# Patient Record
Sex: Male | Born: 1935 | Race: White | Hispanic: No | Marital: Married | Smoking: Former smoker
Health system: Southern US, Community
[De-identification: ages and names within clinical notes are randomized; demographics above are authoritative.]

## PROBLEM LIST (undated history)

## (undated) DIAGNOSIS — M1990 Unspecified osteoarthritis, unspecified site: Secondary | ICD-10-CM

## (undated) DIAGNOSIS — G709 Myoneural disorder, unspecified: Secondary | ICD-10-CM

## (undated) DIAGNOSIS — E119 Type 2 diabetes mellitus without complications: Secondary | ICD-10-CM

## (undated) DIAGNOSIS — I4891 Unspecified atrial fibrillation: Secondary | ICD-10-CM

## (undated) DIAGNOSIS — I2510 Atherosclerotic heart disease of native coronary artery without angina pectoris: Secondary | ICD-10-CM

## (undated) DIAGNOSIS — I340 Nonrheumatic mitral (valve) insufficiency: Secondary | ICD-10-CM

## (undated) DIAGNOSIS — G459 Transient cerebral ischemic attack, unspecified: Secondary | ICD-10-CM

## (undated) DIAGNOSIS — I219 Acute myocardial infarction, unspecified: Secondary | ICD-10-CM

## (undated) DIAGNOSIS — I509 Heart failure, unspecified: Secondary | ICD-10-CM

## (undated) DIAGNOSIS — I440 Atrioventricular block, first degree: Secondary | ICD-10-CM

## (undated) DIAGNOSIS — I10 Essential (primary) hypertension: Secondary | ICD-10-CM

## (undated) DIAGNOSIS — Z89511 Acquired absence of right leg below knee: Secondary | ICD-10-CM

## (undated) DIAGNOSIS — H9190 Unspecified hearing loss, unspecified ear: Secondary | ICD-10-CM

## (undated) DIAGNOSIS — Z89512 Acquired absence of left leg below knee: Secondary | ICD-10-CM

## (undated) DIAGNOSIS — I442 Atrioventricular block, complete: Secondary | ICD-10-CM

## (undated) DIAGNOSIS — E785 Hyperlipidemia, unspecified: Secondary | ICD-10-CM

## (undated) DIAGNOSIS — I739 Peripheral vascular disease, unspecified: Secondary | ICD-10-CM

## (undated) DIAGNOSIS — D649 Anemia, unspecified: Secondary | ICD-10-CM

## (undated) DIAGNOSIS — K922 Gastrointestinal hemorrhage, unspecified: Secondary | ICD-10-CM

## (undated) HISTORY — DX: Peripheral vascular disease, unspecified: I73.9

## (undated) HISTORY — DX: Hyperlipidemia, unspecified: E78.5

## (undated) HISTORY — DX: Anemia, unspecified: D64.9

## (undated) HISTORY — DX: Gastrointestinal hemorrhage, unspecified: K92.2

## (undated) HISTORY — PX: BELOW KNEE LEG AMPUTATION: SUR23

## (undated) HISTORY — DX: Transient cerebral ischemic attack, unspecified: G45.9

## (undated) HISTORY — DX: Essential (primary) hypertension: I10

## (undated) HISTORY — DX: Type 2 diabetes mellitus without complications: E11.9

## (undated) HISTORY — PX: CATARACT EXTRACTION: SUR2

## (undated) HISTORY — DX: Atherosclerotic heart disease of native coronary artery without angina pectoris: I25.10

## (undated) HISTORY — DX: Unspecified atrial fibrillation: I48.91

## (undated) HISTORY — PX: TRANSURETHRAL RESECTION OF PROSTATE: SHX73

---

## 1992-07-22 HISTORY — PX: CORONARY ARTERY BYPASS GRAFT: SHX141

## 2006-11-20 ENCOUNTER — Encounter: Payer: Self-pay | Admitting: Cardiology

## 2006-11-28 ENCOUNTER — Ambulatory Visit: Payer: Self-pay | Admitting: Internal Medicine

## 2007-01-09 ENCOUNTER — Ambulatory Visit: Payer: Self-pay | Admitting: Internal Medicine

## 2007-02-20 ENCOUNTER — Ambulatory Visit: Payer: Self-pay | Admitting: Internal Medicine

## 2007-08-06 DIAGNOSIS — E785 Hyperlipidemia, unspecified: Secondary | ICD-10-CM

## 2007-08-06 DIAGNOSIS — E119 Type 2 diabetes mellitus without complications: Secondary | ICD-10-CM

## 2007-08-06 DIAGNOSIS — I6350 Cerebral infarction due to unspecified occlusion or stenosis of unspecified cerebral artery: Secondary | ICD-10-CM | POA: Insufficient documentation

## 2007-08-07 ENCOUNTER — Ambulatory Visit: Payer: Self-pay | Admitting: Internal Medicine

## 2010-03-22 ENCOUNTER — Encounter: Payer: Self-pay | Admitting: Cardiology

## 2010-03-27 ENCOUNTER — Ambulatory Visit: Payer: Self-pay | Admitting: Cardiology

## 2010-03-28 ENCOUNTER — Encounter: Payer: Self-pay | Admitting: Cardiology

## 2010-07-30 ENCOUNTER — Ambulatory Visit
Admission: RE | Admit: 2010-07-30 | Discharge: 2010-07-30 | Payer: Self-pay | Source: Home / Self Care | Attending: Cardiology | Admitting: Cardiology

## 2010-07-30 ENCOUNTER — Encounter: Payer: Self-pay | Admitting: Cardiology

## 2010-07-30 DIAGNOSIS — I2510 Atherosclerotic heart disease of native coronary artery without angina pectoris: Secondary | ICD-10-CM | POA: Insufficient documentation

## 2010-07-30 DIAGNOSIS — I459 Conduction disorder, unspecified: Secondary | ICD-10-CM | POA: Insufficient documentation

## 2010-07-30 DIAGNOSIS — I4891 Unspecified atrial fibrillation: Secondary | ICD-10-CM | POA: Insufficient documentation

## 2010-08-23 NOTE — Consult Note (Signed)
Summary: [facility] CARDI[facility] CONSULT  [facility]   Imported By: Zachary George 07/30/2010 12:53:41  _____________________________________________________________________  External Attachment:    Type:   Image     Comment:   External Document

## 2010-08-23 NOTE — Letter (Signed)
Summary: [facility] D/C DR.JAMES PARSONS  [facility] D/C DR.JAMES PARSONS   Imported By: Zachary George 07/30/2010 13:08:23  _____________________________________________________________________  External Attachment:    Type:   Image     Comment:   External Document

## 2010-08-23 NOTE — Assessment & Plan Note (Addendum)
Summary: eph-post  mm[facility] per wife Jahan Friedlander   Visit Type:  Follow-up Primary Provider:  Dr. [name]   History of Present Illness: 77 year old male presents for followup. He was seen as an inpatient consult at [facility] back in September 2011. Cardiology followup had been through [facility], Dr. Roxan Hockey. We were consulted at that time to assist with Coumadin therapy. The patient had presented with evidence of anemia and a presumed gastrointestinal hemorrhage. He was seen by Dr. Karilyn Cota, underwent endoscopy and colonoscopy, and had no definite bleeding source noted. There was plan for a followup small bowel capsule study. 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  He is here with his wife today. They have plans to establish with our practice. He is very hard of hearing. History is somewhat limited. He denies any active chest pain or palpitations, no dizziness or syncope. Reports no obvious bleeding problems since he has been back on Coumadin. This is followed by Dr. Loney Hering. He denies any falls.   Preventive Screening-Counseling & Management  Alcohol-Tobacco     Smoking Status: quit     Year Quit: 1942  Current Medications (verified): 1)  Bayer Low Strength 81 Mg  Tbec (Aspirin) .... Take 1 Tablet By Mouth Once A Day 2)  Imdur 60 Mg  Tb24 (Isosorbide Mononitrate) .... Take 1 Tablet By Mouth Once A Day 3)  Vitamin B-12 1000 Mcg  Tabs (Cyanocobalamin) .... Take 1 Tablet By Mouth Once A Day 4)  Lantus 100 Unit/ml  Soln (Insulin Glargine) .... Use As Directed 5)  Humulin 70/30 70-30 %  Susp (Insulin Isophane & Regular) .... Use As Directed 6)  Sotalol Hcl 80 Mg  Tabs (Sotalol Hcl) .... Take 1/4 Tab By Mouth Two Times A Day 7)  Warfarin Sodium 5 Mg Tabs (Warfarin Sodium) .... Use As Directed Per [facility] 8)  Vitamin C 500 Mg  Tabs (Ascorbic Acid) .... Take 1 Tablet By Mouth  Once A Day 9)  Zocor 40 Mg  Tabs (Simvastatin) .... Take 1 Tablet  By Mouth Once A Day 10)  Enalapril Maleate 20 Mg  Tabs (Enalapril Maleate) .... Take 1 Tablet By Mouth Once A Day 11)  Furosemide 20 Mg  Tabs (Furosemide) .... Take 1 Tablet By Mouth Once A Day 12)  Zyrtec Allergy 10 Mg Caps (Cetirizine Hcl) .... Take 1 Tablet By Mouth Once A Day 13)  Tizanidine Hcl 4 Mg Tabs (Tizanidine Hcl) .... Take 1/2 Tablet By Mouth Two Times A Day 14)  Nitroglycerin .... Use As Needed 15)  Norco 10-325 Mg Tabs (Hydrocodone-Acetaminophen) .... Take 1 Tablet By Mouth Four Times A Day As Needed Severe Pain 16)  Gabapentin 300 Mg Caps (Gabapentin) .... Take 1 Tablet By Mouth Three Times A Day 17)  Colace 100 Mg Caps (Docusate Sodium) .... Take 1 Tablet By Mouth Once A Day  Allergies (verified): 1)  ! Cipro 2)  ! Keflex  Comments:  Nurse/Medical Assistant: The patient's medication bottles and allergies were reviewed with the patient and were updated in the Medication and Allergy Lists.  Past History:  Family History: Last updated: 07/30/2010 No premature cardiovascular disease  Social History: Last updated: 07/30/2010 Married  Tobacco Use - Former.  Alcohol Use - no Retired   Past Medical History: Atrial Fibrillation - sotalol and Coumadin CAD - multivessel TIA Anemia - GI bleed Diabetes Type 2 Hyperlipidemia Peripheral arterial disease  Past Surgical History: CABG 1994 TURP Bilateral below-the-knee  amputations  Family History: No premature cardiovascular disease  Social History: Married  Tobacco Use - Former.  Alcohol Use - no Retired   Review of Systems  The patient denies anorexia, fever, chest pain, syncope, dyspnea on exertion, prolonged cough, abdominal pain, melena, and hematochezia.         Otherwise reviewed and negative.  Vital Signs:  Patient profile:   77 year old male Height:      71 inches Weight:      190 pounds BMI:     26.60 Pulse rate:   72 / minute BP sitting:   146 / 74  (left arm) Cuff size:    regular  Vitals Entered By: Carlye Grippe (July 30, 2010 2:50 PM)  Physical Exam  Additional Exam:  Chronically ill-appearing male in no acute distress. HEENT: Conjunctiva and lids normal, oropharynx with poor dentition. Neck: Supple, no elevated JVP. No thyromegaly or bruits. Lungs: Diminished breath sounds, nonlabored. Cardiac: Regular rate and rhythm, soft systolic murmur, no S3 gallop. Abdomen: Soft, nontender, bowel sounds present. Extremities: Status post bilateral below the knee amputations with prostheses in place Skin: Warm and dry. Musculoskeletal: No kyphosis. Neuropsychiatric: Alert and oriented x3, very hard of hearing, affect appropriate.   Echocardiogram  Procedure date:  03/27/2010  Findings:      Mild LVH with LVEF 45-50%, pseudo-normal diastolic filling pattern, inferolateral hypokinesis, MAC with mild mitral regurgitation, trace aortic regurgitation, mild tricuspid regurgitation, RVSP 16 mm mercury.  EKG  Procedure date:  07/30/2010  Findings:      Sinus rhythm at 69 beats per minute with prolonged PR interval of 424 ms, QRS duration 146 ms consistent with left bundle branch block pattern.  Impression & Recommendations:  Problem # 1:  ATRIAL FIBRILLATION (ICD-427.31)  Paroxysmal based on available information. Patient previously followed long term at [facility]. Records will be requested. He has been on sotalol for many years along with Coumadin. Coumadin now followed by Dr. Loney Hering. No obvious recurrent gastrointestinal bleeding with documentation of small AVMs in the duodenum and jejunum by Dr. Karilyn Cota. Followup scheduled over the next 3 months.  His updated medication list for this problem includes:    Bayer Low Strength 81 Mg Tbec (Aspirin) .Marland Kitchen... Take 1 tablet by mouth once a day    Sotalol Hcl 80 Mg Tabs (Sotalol hcl) .Marland Kitchen... Take 1/4 tab by mouth two times a day    Warfarin Sodium 5 Mg Tabs (Warfarin sodium) ..... Use as directed per [facility]  Problem  # 2:  CORONARY ATHEROSCLEROSIS NATIVE CORONARY ARTERY (ICD-414.01)  Need prior records from [facility]. Patient status post CABG in 1994. No obvious angina.  His updated medication list for this problem includes:    Bayer Low Strength 81 Mg Tbec (Aspirin) .Marland Kitchen... Take 1 tablet by mouth once a day    Imdur 60 Mg Tb24 (Isosorbide mononitrate) .Marland Kitchen... Take 1 tablet by mouth once a day    Sotalol Hcl 80 Mg Tabs (Sotalol hcl) .Marland Kitchen... Take 1/4 tab by mouth two times a day    Warfarin Sodium 5 Mg Tabs (Warfarin sodium) ..... Use as directed per [facility]    Enalapril Maleate 20 Mg Tabs (Enalapril maleate) .Marland Kitchen... Take 1 tablet by mouth once a day  Problem # 3:  HYPERLIPIDEMIA (ICD-272.4)  Followed by Dr. Loney Hering on statin therapy.  His updated medication list for this problem includes:    Zocor 40 Mg Tabs (Simvastatin) .Marland Kitchen... Take 1 tablet by mouth once a day  Problem #  4:  CARDIAC CONDUCTION DISTURBANCE (ICD-426.9)  Evidence of conduction system disease with left bundle branch block and significantly prolonged PR interval at baseline in sinus rhythm. No reported problems with dizziness or syncope however. On very low-dose sotalol. Will follow at this point.  His updated medication list for this problem includes:    Bayer Low Strength 81 Mg Tbec (Aspirin) .Marland Kitchen... Take 1 tablet by mouth once a day    Imdur 60 Mg Tb24 (Isosorbide mononitrate) .Marland Kitchen... Take 1 tablet by mouth once a day    Sotalol Hcl 80 Mg Tabs (Sotalol hcl) .Marland Kitchen... Take 1/4 tab by mouth two times a day    Warfarin Sodium 5 Mg Tabs (Warfarin sodium) ..... Use as directed per [facility]    Enalapril Maleate 20 Mg Tabs (Enalapril maleate) .Marland Kitchen... Take 1 tablet by mouth once a day  Other Orders: EKG w/ Interpretation (93000)  Patient Instructions: 1)  Your physician recommends that you continue on your current medications as directed. Please refer to the Current Medication list given to you today. 2)  Follow up in  3 months  Appended Document:  eph-post  mm[facility] per wife Dianelys Scinto Records from [facility] received and reviewed. Office note from 5/08 with Dr. Roxan Hockey indicates CABG in 1994 with patent bypass grafts noted at catheterization in June 2002. Also prior history of right femoral artery pseudoaneurysm repair, atrial fibrillation, TIA, right carotid endarterectomy, hypertension, type 2 diabetes mellitus, hyperlipidemia, osteoarthritis, pneumonia and hemoptysis. He was on both Sotalol and Coumadin at that time.

## 2010-08-23 NOTE — Letter (Addendum)
Summary: [facility] CARDOLOGY OFFICE NOTES  [facility] CARDOLOGY OFFICE NOTES   Imported By: Cyril Loosen, RN, BSN 08/17/2010 08:47:24  _____________________________________________________________________  External Attachment:    Type:   Image     Comment:   External Document

## 2010-08-23 NOTE — Medication Information (Signed)
Summary: [facility] MEDICATION D/C ORDERS  [facility] MEDICATION D/C ORDERS   Imported By: Zachary George 07/30/2010 12:56:53  _____________________________________________________________________  External Attachment:    Type:   Image     Comment:   External Document

## 2010-12-04 NOTE — Assessment & Plan Note (Signed)
[facility]                             PULMONARY OFFICE NOTE   NAME:Tyler Moon, Tyler Moon                      MRN:          [id number]  DATE:11/28/2006                            DOB:          02/26/1923    PULMONARY ALLERGY CONSULTATION   PROBLEM:  Allergy consultation at the kind request of Dr. Celine Mans for this  77 year old man.  His wife has been a patient here.  He complains of  sneeze, rhinorrhea, and post-nasal drip.   HISTORY:  He is a little hard of hearing, and his wife does much of the  talking, but together, they describe complaints of sneezing, rhinorrhea,  post-nasal drip as perennial for many years, usually worse in the spring  and fall.  They have not noticed any real help from treatments tried,  including standard antihistamines, Nasonex, which caused epistaxis.  Occasional mild shortness of breath, but not much.  No awareness of  chest tightness.  Sneezing is worse in the mornings, but apparently can  be relatively disruptive.   MEDICATIONS:  1. Allegra 180 mg.  2. Zyrtec for comparison 10 mg.  3. Furosemide 20 mg.  4. Betapace 80 mg times 1/4 b.i.d.  5. Isorbid 60 mg.  6. Humalog 75/25.  7. Nitroglycerin p.r.n.  8. Lorcet plus 7.5/650 q.4h p.r.n.  9. Coumadin.  10.Flonase.  11.Enalapril 20 mg.  12.Lantus.  13.Simvastatin 40 mg.  14.Doxazosin 4 mg times 1/2.  15.Flexeril 10 mg 1 or 2 at bedtime p.r.n.  16.Aspirin 81 mg.  17.Imdur 30 mg.   No medication allergy reported.   REVIEW OF SYSTEMS:  Hard of hearing, nasal congestion, sneezing.  It is  not clear from talking to him that he recognizes an environmental or  weather change as a trigger, and he is not clear as to specific exposure  that may be problems.   PAST HISTORY:  Remote sinus infection.  No ENT surgery.  Antihistamines  blamed for hematuria a few years ago.  Prostatism.  Cerebrovascular  accident.  Elevated cholesterol.  Diabetes.  Myocardial infarction with  angioplasty and bypass surgery.  Spine surgery.  Hernia repair.  Eye  surgery.  Bilateral amputation below the knee.   SOCIAL HISTORY:  He is retired from work as a Armed forces logistics/support/administrative officer.  We presume asbestos exposure.  Married.  He quit smoking in 1973.  No  alcohol.   FAMILY HISTORY:  Allergies, rheumatism.   OBJECTIVE:  Weight 180 pounds, BP 176/92, pulse 84, room air saturation  94%.  This is a pleasant gentleman, hard of hearing.  He walks with some  difficulty using a cane.  ADENOPATHY:  None found.  HEENT:  Nasal airway unobstructed.  Oropharynx is clear, slightly  glandular without visible drainage or hoarseness.  CHEST:  Clear to P and A.  HEART:  Sounds regular without murmur or gallop.  HANDS:  No cyanosis or edema.   IMPRESSION:  Perennial rhinitis with nasal congestion.  There may be an  allergic as well as non-allergic component.   PLAN:  He is given trials for assessment of Singulair 10 mg  daily and  ipratropium 0.06% nasal spray once daily each nostril.  He is going to  schedule return for skin testing, earlier p.r.n.  I appreciate the  chance to see him and will try to answer questions as I am able.     Clinton D. Maple Hudson, MD, Tonny Bollman, FACP  Electronically Signed    CDY/MedQ  DD: 11/28/2006  DT: 11/29/2006  Job #: [id number]   cc:   Dr. Atilano Median

## 2010-12-04 NOTE — Assessment & Plan Note (Signed)
[facility]                             PULMONARY OFFICE NOTE   NAME:Tyler Moon, [name]                      MRN:          [id number]  DATE:02/20/2007                            DOB:          05/17/1923    PROBLEMS:  Rhinitis.   HISTORY:  Still much sneezing and nasal mucus.  Ipratropium 0.03% nasal  spray has not been enough help.  I get the impression it bothers his  wife more than it bothers him.  They both deny any chest congestion or  headache.  CT scan of the sinuses at [facility] on January 15, 2007 showed  secretions versus mucosal retention cyst or polyp in the inferior aspect  of the left frontal sinus, otherwise just scattered areas of some  mucosal thickening with no acute sinusitis.  I reviewed this with them.   MEDICATIONS:  1. Aspirin 81 mg.  2. Imdur 60 mg.  3. Vitamin B12 with 1000 mcg.  4. Lantus 35 units.  5. Humulin 70/30 38 units.  6. Sotolol 80 mg x1/4 b.i.d.  7. Coumadin.  8. Vitamins.  9. Simvastatin 40 mg.  10.Enalapril 20 mg.  11.Furosemide 20 mg.  12.Fexofenadine 180 mg.  13.Singulair 10 mg.  14.Cyclobenzaprine p.r.n.  15.Nitroglycerin.  16.Hydrocodone.   No medication allergy.   OBJECTIVE:  Weight 191 pounds.  BP 136/80.  Pulse 79.  Room air  saturation 96%.  Right BKA/wheelchair.  No obvious distress.  No periorbital edema or conjunctival injection.  There is moderate white  mucus in both nostrils and a little postnasal drip.   IMPRESSION:  Rhinitis, nonspecific.   PLAN:  Try Omnaris 1 spray each nostril daily.  If the sample is  helpful, they will call for a generic prescription with fluticasone.  For now, he will return in four months, earlier p.r.n.     Clinton D. Maple Hudson, MD, Tonny Bollman, FACP  Electronically Signed    CDY/MedQ  DD: 02/21/2007  DT: 02/22/2007  Job #: [id number]   cc:   Atilano Median, M.D.
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
## 2011-04-11 ENCOUNTER — Encounter: Payer: Self-pay | Admitting: Cardiology

## 2011-05-23 ENCOUNTER — Ambulatory Visit: Payer: Self-pay | Admitting: Cardiology

## 2011-06-05 ENCOUNTER — Encounter: Payer: Self-pay | Admitting: Cardiology

## 2011-06-10 ENCOUNTER — Encounter: Payer: Self-pay | Admitting: Cardiology

## 2011-06-11 ENCOUNTER — Ambulatory Visit (INDEPENDENT_AMBULATORY_CARE_PROVIDER_SITE_OTHER): Payer: Medicare Other | Admitting: Cardiology

## 2011-06-11 ENCOUNTER — Encounter: Payer: Self-pay | Admitting: Cardiology

## 2011-06-11 VITALS — BP 126/69 | HR 64 | Ht 74.0 in | Wt 189.8 lb

## 2011-06-11 DIAGNOSIS — I4891 Unspecified atrial fibrillation: Secondary | ICD-10-CM

## 2011-06-11 DIAGNOSIS — E785 Hyperlipidemia, unspecified: Secondary | ICD-10-CM

## 2011-06-11 DIAGNOSIS — I2510 Atherosclerotic heart disease of native coronary artery without angina pectoris: Secondary | ICD-10-CM

## 2011-06-11 NOTE — Assessment & Plan Note (Signed)
Symptomatically stable medical therapy. Continue observation. 

## 2011-06-11 NOTE — Assessment & Plan Note (Signed)
Followed by Dr. Bluth. 

## 2011-06-11 NOTE — Progress Notes (Signed)
Clinical Summary Tyler Moon is a 77 y.o.male presenting for followup. He was seen in January.  Records from [facility] received and reviewed. Office note from 5/08 with Dr. Roxan Hockey indicates CABG in 1994 with patent bypass grafts noted at catheterization in June 2002. Also prior history of right femoral artery pseudoaneurysm repair, atrial fibrillation, TIA, right carotid endarterectomy, hypertension, type 2 diabetes mellitus, hyperlipidemia, osteoarthritis, pneumonia and hemoptysis. He was on both Sotalol and Coumadin at that time.  He is here with his wife today. Mainly complains of arthritic pain in his shoulders, back, knees. Still gets outdoors for small chores as best he is able. Reports no obvious angina.  Followup ECG is reviewed below. He continues to see Dr. Loney Hering regularly with lab work and for Coumadin followup. He reports no bleeding problems.   Allergies  Allergen Reactions  . Cephalexin   . Ciprofloxacin     Medication list reviewed.  Past Medical History  Diagnosis Date  . Atrial fibrillation   . Coronary atherosclerosis of native coronary artery     Multivessel  . TIA (transient ischemic attack)   . Anemia   . GI bleed   . Diabetes type 2, controlled   . Hyperlipidemia   . Peripheral arterial disease     Past Surgical History  Procedure Date  . Coronary artery bypass graft 1994  . Transurethral resection of prostate   . Below knee leg amputation     Bilateral    Family History  Problem Relation Age of Onset  . Heart disease Neg Hx     Social History Tyler Moon reports that he has quit smoking. His smoking use included Cigarettes. He has never used smokeless tobacco. Tyler Moon reports that he does not drink alcohol.  Review of Systems No palpitations or syncope. Appetite stable. Otherwise reviewed and negative except as otherwise outlined.  Physical Examination Filed Vitals:   06/11/11 0912  BP: 126/69  Pulse: 64    Chronically ill-appearing male  in no acute distress.  HEENT: Conjunctiva and lids normal, oropharynx with poor dentition.  Neck: Supple, no elevated JVP. No thyromegaly or bruits.  Lungs: Diminished breath sounds, nonlabored.  Cardiac: Regular rate and rhythm, soft systolic murmur, no S3 gallop.  Abdomen: Soft, nontender, bowel sounds present.  Extremities: Status post bilateral below the knee amputations with prostheses in place  Skin: Warm and dry.  Musculoskeletal: No kyphosis.  Neuropsychiatric: Alert and oriented x3, very hard of hearing, affect appropriate.  ECG Sinus rhythm at 66 with significantly prolonged PR interval as before, left bundle branch block, QTC 450 ms.   Problem List and Plan

## 2011-06-11 NOTE — Assessment & Plan Note (Addendum)
No obvious breakthrough atrial fibrillation, in sinus rhythm today. ECG shows evidence of underlying conduction system disease with left bundle branch block and significant PR prolongation as seen previously. He has had no dizziness or syncope. Sotalol is at only 20 mg twice daily. No changes for now. Coumadin followed by Dr. Loney Hering.

## 2011-06-11 NOTE — Patient Instructions (Signed)
Your physician wants you to follow-up in: 6 months. You will receive a reminder letter in the mail one-two months in advance. If you don't receive a letter, please call our office to schedule the follow-up appointment. Your physician recommends that you continue on your current medications as directed. Please refer to the Current Medication list given to you today. 

## 2011-12-12 ENCOUNTER — Encounter: Payer: Self-pay | Admitting: Cardiology

## 2011-12-12 ENCOUNTER — Ambulatory Visit (INDEPENDENT_AMBULATORY_CARE_PROVIDER_SITE_OTHER): Payer: Medicare Other | Admitting: Cardiology

## 2011-12-12 VITALS — BP 176/72 | HR 62 | Ht 72.0 in | Wt 192.8 lb

## 2011-12-12 DIAGNOSIS — I459 Conduction disorder, unspecified: Secondary | ICD-10-CM

## 2011-12-12 DIAGNOSIS — I4891 Unspecified atrial fibrillation: Secondary | ICD-10-CM

## 2011-12-12 DIAGNOSIS — I2510 Atherosclerotic heart disease of native coronary artery without angina pectoris: Secondary | ICD-10-CM

## 2011-12-12 NOTE — Assessment & Plan Note (Signed)
Symptomatically stable on medical therapy. Continue observation with followup arranged. 

## 2011-12-12 NOTE — Patient Instructions (Signed)
Your physician recommends that you schedule a follow-up appointment in:6 months. You will receive a reminder letter in the mail in about 4 months reminding you to call and schedule your appointment. If you don't receive this letter, please contact our office.   Your physician recommends that you continue on your current medications as directed. Please refer to the Current Medication list given to you today.   We will be requesting your recent lab work from Dr. Pauletta Browns office.

## 2011-12-12 NOTE — Assessment & Plan Note (Signed)
Significant PR prolongation with left bundle branch block, symptomatically stable. Reluctant to discontinue sotalol as this has provided very good control of his atrial fibrillation.

## 2011-12-12 NOTE — Progress Notes (Signed)
Clinical Summary Tyler Moon is a 78 y.o.male presenting for followup. He was seen in November 2012. He is here with his wife today. Reports no chest pain symptoms or progressive shortness of breath. Uses a walker to ambulate typically.  He denies any bleeding problems on Coumadin, has had no significant falls. States that he just saw Dr. Loney Moon yesterday and had lab work obtained.  Outside records reviewed at the last visit. His ECG continues to show sinus rhythm with evidence of conduction system disease, however he has tolerated his current medications very well including sotalol.   Allergies  Allergen Reactions  . Cephalexin   . Ciprofloxacin     Current Outpatient Prescriptions  Medication Sig Dispense Refill  . aspirin 81 MG tablet Take 81 mg by mouth daily.        Marland Kitchen azelastine (ASTELIN) 137 MCG/SPRAY nasal spray Place 2 sprays into the nose 2 (two) times daily. Use in each nostril as directed      . docusate sodium (COLACE) 100 MG capsule Take 100 mg by mouth daily.        . enalapril (VASOTEC) 20 MG tablet Take 20 mg by mouth daily.        . furosemide (LASIX) 20 MG tablet Take 20 mg by mouth daily.        Marland Kitchen gabapentin (NEURONTIN) 300 MG capsule Take 300 mg by mouth 3 (three) times daily.        . insulin glargine (LANTUS) 100 UNIT/ML injection Inject into the skin as directed.        . insulin lispro (HUMALOG) 100 UNIT/ML injection Inject 7 Units into the skin daily. Take 7 units if blood sugar is 200 or greater.      . isosorbide mononitrate (IMDUR) 60 MG 24 hr tablet Take 60 mg by mouth daily.        Marland Kitchen NITROGLYCERIN ER PO Take by mouth as needed.        . ranitidine (ZANTAC) 75 MG tablet Take 75 mg by mouth daily.        . simvastatin (ZOCOR) 40 MG tablet Take 40 mg by mouth at bedtime.        . sotalol (BETAPACE) 80 MG tablet Take 20 mg by mouth 2 (two) times daily.        Marland Kitchen tiZANidine (ZANAFLEX) 4 MG capsule Take 2 mg by mouth 2 (two) times daily.        . vitamin B-12  (CYANOCOBALAMIN) 1000 MCG tablet Take 1,000 mcg by mouth daily.        . vitamin C (ASCORBIC ACID) 500 MG tablet Take 500 mg by mouth daily.        Marland Kitchen warfarin (COUMADIN) 5 MG tablet Take 5 mg by mouth as directed. Managed by Dr. Loney Moon        Past Medical History  Diagnosis Date  . Atrial fibrillation   . Coronary atherosclerosis of native coronary artery     Multivessel  . TIA (transient ischemic attack)   . Anemia   . GI bleed   . Diabetes type 2, controlled   . Hyperlipidemia   . Peripheral arterial disease   . Essential hypertension, benign     Past Surgical History  Procedure Date  . Coronary artery bypass graft 1994  . Transurethral resection of prostate   . Below knee leg amputation     Bilateral    Social History Tyler Moon reports that he has quit smoking. His smoking use  included Cigarettes. He has never used smokeless tobacco. Tyler Moon reports that he does not drink alcohol.  Review of Systems No palpitations or syncope. Good appetite. No melena or hematochezia. Hard of hearing. Otherwise negative  Physical Examination Filed Vitals:   12/12/11 1341  BP: 176/72  Pulse: 62    Chronically ill-appearing male in no acute distress.  HEENT: Conjunctiva and lids normal, oropharynx with poor dentition.  Neck: Supple, no elevated JVP. No thyromegaly or bruits.  Lungs: Diminished breath sounds, nonlabored.  Cardiac: Regular rate and rhythm, soft systolic murmur, no S3 gallop.  Abdomen: Soft, nontender, bowel sounds present.  Extremities: Status post bilateral below the knee amputations with prostheses in place  .   ECG Sinus rhythm at 62 with significant PR prolongation, left bundle branch block, noted previously.   Problem List and Plan   CORONARY ATHEROSCLEROSIS NATIVE CORONARY ARTERY Symptomatically stable on medical therapy. Continue observation with followup arranged.  CARDIAC CONDUCTION DISTURBANCE Significant PR prolongation with left bundle branch  block, symptomatically stable. Reluctant to discontinue sotalol as this has provided very good control of his atrial fibrillation.  ATRIAL FIBRILLATION Maintaining sinus rhythm, also on Coumadin followed by Dr. Loney Moon.     Jonelle Sidle, M.D., F.A.C.C.

## 2011-12-12 NOTE — Assessment & Plan Note (Signed)
Maintaining sinus rhythm, also on Coumadin followed by Dr. Loney Hering.

## 2012-07-07 ENCOUNTER — Ambulatory Visit (INDEPENDENT_AMBULATORY_CARE_PROVIDER_SITE_OTHER): Payer: Medicare Other | Admitting: Cardiology

## 2012-07-07 ENCOUNTER — Encounter: Payer: Self-pay | Admitting: Cardiology

## 2012-07-07 VITALS — BP 145/73 | HR 67 | Ht 72.0 in | Wt 192.0 lb

## 2012-07-07 DIAGNOSIS — E785 Hyperlipidemia, unspecified: Secondary | ICD-10-CM

## 2012-07-07 DIAGNOSIS — I4891 Unspecified atrial fibrillation: Secondary | ICD-10-CM

## 2012-07-07 DIAGNOSIS — I2510 Atherosclerotic heart disease of native coronary artery without angina pectoris: Secondary | ICD-10-CM

## 2012-07-07 DIAGNOSIS — I459 Conduction disorder, unspecified: Secondary | ICD-10-CM

## 2012-07-07 NOTE — Patient Instructions (Addendum)

## 2012-07-07 NOTE — Progress Notes (Signed)
Clinical Summary Tyler Moon is an 78 y.o.male presenting for followup. He was seen in May. He is here with his wife today. Denies any angina with stable dyspnea on exertion. No palpitations. He has had some difficulty with ulcerations at his lower extremity stump sites and is followed in the wound clinic. He has not been able to ambulate with his prostheses recently.  ECG today shows sinus rhythm with significantly prolonged PR interval as before, IVCD. He has had no dizziness or syncope. Reports tolerating Coumadin without any bleeding problems. No recent falls.   Allergies  Allergen Reactions  . Cephalexin   . Ciprofloxacin     Current Outpatient Prescriptions  Medication Sig Dispense Refill  . aspirin 81 MG tablet Take 81 mg by mouth daily.        Marland Kitchen azelastine (ASTELIN) 137 MCG/SPRAY nasal spray Place 2 sprays into the nose 2 (two) times daily. Use in each nostril as directed      . docusate sodium (COLACE) 100 MG capsule Take 100 mg by mouth daily.        . enalapril (VASOTEC) 20 MG tablet Take 20 mg by mouth daily.        . furosemide (LASIX) 20 MG tablet Take 20 mg by mouth daily.        Marland Kitchen gabapentin (NEURONTIN) 300 MG capsule Take 1 capsule by moth in the morning, 1 at noon and take 2 at bedtime      . insulin glargine (LANTUS) 100 UNIT/ML injection Inject 55 Units into the skin as directed.       . insulin NPH-insulin regular (HUMULIN 70/30) (70-30) 100 UNIT/ML injection Inject 20-25 Units into the skin 2 (two) times daily with a meal.      . insulin regular (NOVOLIN R,HUMULIN R) 100 units/mL injection Inject 5 Units into the skin as directed.      . isosorbide mononitrate (IMDUR) 60 MG 24 hr tablet Take 60 mg by mouth daily.        Marland Kitchen NITROGLYCERIN ER PO Take 1 tablet by mouth every 5 (five) minutes x 3 doses as needed.       . ranitidine (ZANTAC) 150 MG tablet Take 150 mg by mouth 2 (two) times daily.      . simvastatin (ZOCOR) 40 MG tablet Take 40 mg by mouth at bedtime.         . sotalol (BETAPACE) 80 MG tablet Take 20 mg by mouth 2 (two) times daily.       Marland Kitchen tiZANidine (ZANAFLEX) 4 MG capsule Take 2 mg by mouth 2 (two) times daily.        . vitamin B-12 (CYANOCOBALAMIN) 1000 MCG tablet Take 1,000 mcg by mouth daily.        . vitamin C (ASCORBIC ACID) 500 MG tablet Take 500 mg by mouth daily.        Marland Kitchen warfarin (COUMADIN) 5 MG tablet Take 5 mg by mouth as directed. Managed by Dr. Loney Hering        Past Medical History  Diagnosis Date  . Atrial fibrillation   . Coronary atherosclerosis of native coronary artery     Multivessel  . TIA (transient ischemic attack)   . Anemia   . GI bleed   . Diabetes type 2, controlled   . Hyperlipidemia   . Peripheral arterial disease   . Essential hypertension, benign     Past Surgical History  Procedure Date  . Coronary artery bypass graft 1994  .  Transurethral resection of prostate   . Below knee leg amputation     Bilateral    Social History Tyler Moon reports that he has quit smoking. His smoking use included Cigarettes. He has never used smokeless tobacco. Tyler Moon reports that he does not drink alcohol.  Review of Systems Hard of hearing. Appetite has been stable. No significant weight change since prior visit. No hospitalizations. Otherwise negative.  Physical Examination Filed Vitals:   07/07/12 1039  BP: 145/73  Pulse: 67   Filed Weights   07/07/12 1039  Weight: 192 lb (87.091 kg)    Chronically ill-appearing male in no acute distress. In wheelchair. HEENT: Conjunctiva and lids normal, oropharynx with poor dentition.  Neck: Supple, no elevated JVP. No thyromegaly or bruits.  Lungs: Diminished breath sounds, nonlabored.  Cardiac: Regular rate and rhythm, soft systolic murmur, no S3 gallop.  Abdomen: Soft, nontender, bowel sounds present.  Extremities: Status post bilateral below the knee amputations, right side with prosthesis and left side dressed    Problem List and Plan   ATRIAL  FIBRILLATION Maintaining sinus rhythm on current regimen, no reported bleeding problems on Coumadin.  CARDIAC CONDUCTION DISTURBANCE ECG with IVCD and significant prolongation of PR interval. Has however tolerated sotalol and no changes are being made. He has had no dizziness or syncope.  CORONARY ATHEROSCLEROSIS NATIVE CORONARY ARTERY No active angina. Continue observation.  HYPERLIPIDEMIA Reports followup and lab work with Dr. Loney Hering.    Jonelle Sidle, M.D., F.A.C.C.

## 2012-07-08 NOTE — Assessment & Plan Note (Signed)
No active angina. Continue observation.

## 2012-07-08 NOTE — Assessment & Plan Note (Signed)
ECG with IVCD and significant prolongation of PR interval. Has however tolerated sotalol and no changes are being made. He has had no dizziness or syncope.

## 2012-07-08 NOTE — Assessment & Plan Note (Signed)
Reports followup and lab work with Dr. Loney Hering.

## 2012-07-08 NOTE — Assessment & Plan Note (Signed)
Maintaining sinus rhythm on current regimen, no reported bleeding problems on Coumadin.

## 2013-01-14 ENCOUNTER — Ambulatory Visit (INDEPENDENT_AMBULATORY_CARE_PROVIDER_SITE_OTHER): Payer: Medicare Other | Admitting: Cardiology

## 2013-01-14 ENCOUNTER — Encounter: Payer: Self-pay | Admitting: Cardiology

## 2013-01-14 VITALS — BP 173/67 | HR 61 | Ht 72.0 in | Wt 199.0 lb

## 2013-01-14 DIAGNOSIS — I4891 Unspecified atrial fibrillation: Secondary | ICD-10-CM

## 2013-01-14 DIAGNOSIS — E785 Hyperlipidemia, unspecified: Secondary | ICD-10-CM

## 2013-01-14 DIAGNOSIS — I2510 Atherosclerotic heart disease of native coronary artery without angina pectoris: Secondary | ICD-10-CM

## 2013-01-14 NOTE — Patient Instructions (Addendum)

## 2013-01-14 NOTE — Progress Notes (Signed)
Clinical Summary [name] is an 79 y.o.male last seen in December 2013. He is here with his wife. Very hard of hearing, she provides much of his history. He denies any chest pain, has limited mobility, uses a walker at home, and a cane when he gets outdoors. He denies any falls. His wife states that he seems to be doing fairly well.  Blood pressure is elevated today, his wife checks his blood pressure at home and reports systolics in the 130s to 140s.  He continues to follow with Dr. Loney Moon for lipid management. LDL was 62 back in December. He denies any bleeding problems on Coumadin. No sense of palpitations. Heart rate is regular today on examination.   Allergies  Allergen Reactions  . Cephalexin   . Ciprofloxacin     Current Outpatient Prescriptions  Medication Sig Dispense Refill  . aspirin 81 MG tablet Take 81 mg by mouth daily.        Marland Kitchen azelastine (ASTELIN) 137 MCG/SPRAY nasal spray Place 2 sprays into the nose 2 (two) times daily. Use in each nostril as directed      . docusate sodium (COLACE) 100 MG capsule Take 100 mg by mouth daily.        . enalapril (VASOTEC) 20 MG tablet Take 20 mg by mouth daily.        . furosemide (LASIX) 20 MG tablet Take 20 mg by mouth daily.        Marland Kitchen gabapentin (NEURONTIN) 600 MG tablet Take 600 mg by mouth 2 (two) times daily. And 2 at bedtime      . insulin glargine (LANTUS) 100 UNIT/ML injection Inject 55 Units into the skin as directed.       . insulin NPH-insulin regular (HUMULIN 70/30) (70-30) 100 UNIT/ML injection Inject 20-25 Units into the skin 2 (two) times daily with a meal.      . insulin regular (NOVOLIN R,HUMULIN R) 100 units/mL injection Inject 5 Units into the skin as directed.      . isosorbide mononitrate (IMDUR) 60 MG 24 hr tablet Take 60 mg by mouth daily.        Marland Kitchen NITROGLYCERIN ER PO Take 1 tablet by mouth every 5 (five) minutes x 3 doses as needed.       . ranitidine (ZANTAC) 150 MG tablet Take 150 mg by mouth 2 (two) times  daily.      . simvastatin (ZOCOR) 40 MG tablet Take 40 mg by mouth at bedtime.        . sotalol (BETAPACE) 80 MG tablet Take 20 mg by mouth 2 (two) times daily.       Marland Kitchen tiZANidine (ZANAFLEX) 4 MG capsule Take 2 mg by mouth 2 (two) times daily.        . vitamin B-12 (CYANOCOBALAMIN) 1000 MCG tablet Take 1,000 mcg by mouth daily.        . vitamin C (ASCORBIC ACID) 500 MG tablet Take 500 mg by mouth daily.        Marland Kitchen warfarin (COUMADIN) 5 MG tablet Take 5 mg by mouth as directed. Managed by Dr. Loney Moon       No current facility-administered medications for this visit.    Past Medical History  Diagnosis Date  . Atrial fibrillation   . Coronary atherosclerosis of native coronary artery     Multivessel  . TIA (transient ischemic attack)   . Anemia   . GI bleed   . Diabetes type 2, controlled   .  Hyperlipidemia   . Peripheral arterial disease   . Essential hypertension, benign    Past Surgical History  Procedure Laterality Date  . Coronary artery bypass graft  1994  . Transurethral resection of prostate    . Below knee leg amputation      Bilateral    Social History [name] reports that he has quit smoking. His smoking use included Cigarettes. He smoked 0.00 packs per day. He has never used smokeless tobacco. [name] reports that he does not drink alcohol.  Review of Systems Very hard of hearing. Chronic arthritic pain in his back and shoulders. No falls. No bleeding problems. Stable appetite. Otherwise negative.  Physical Examination Filed Vitals:   01/14/13 1259  BP: 173/67  Pulse: 61   Filed Weights   01/14/13 1259  Weight: 199 lb (90.266 kg)    Chronically ill-appearing male, comfortable at rest. HEENT: Conjunctiva and lids normal, oropharynx with poor dentition.  Neck: Supple, no elevated JVP. No thyromegaly or bruits.  Lungs: Diminished breath sounds, nonlabored.  Cardiac: Regular rate and rhythm, soft systolic murmur, no S3 gallop.  Abdomen: Soft, nontender,  bowel sounds present.  Extremities: Status post bilateral below the knee amputations, right side with prosthesis and left side dressed    Problem List and Plan   Atrial fibrillation Maintaining sinus rhythm on current regimen including sotalol and Coumadin.  CORONARY ATHEROSCLEROSIS NATIVE CORONARY ARTERY No active angina on medical therapy.  HYPERLIPIDEMIA LDL has been at goal on statin, followed by Dr. Loney Moon.    Tyler Moon, M.D., F.A.C.C.

## 2013-01-14 NOTE — Assessment & Plan Note (Signed)
No active angina on medical therapy.

## 2013-01-14 NOTE — Assessment & Plan Note (Signed)
Maintaining sinus rhythm on current regimen including sotalol and Coumadin.

## 2013-01-14 NOTE — Assessment & Plan Note (Signed)
LDL has been at goal on statin, followed by Dr. Loney Hering.

## 2013-07-05 ENCOUNTER — Encounter: Payer: Self-pay | Admitting: Cardiology

## 2013-07-05 ENCOUNTER — Ambulatory Visit (INDEPENDENT_AMBULATORY_CARE_PROVIDER_SITE_OTHER): Payer: Medicare Other | Admitting: Cardiology

## 2013-07-05 VITALS — BP 127/62 | HR 67 | Ht 70.0 in | Wt 199.8 lb

## 2013-07-05 DIAGNOSIS — I4891 Unspecified atrial fibrillation: Secondary | ICD-10-CM

## 2013-07-05 DIAGNOSIS — E785 Hyperlipidemia, unspecified: Secondary | ICD-10-CM

## 2013-07-05 DIAGNOSIS — I2510 Atherosclerotic heart disease of native coronary artery without angina pectoris: Secondary | ICD-10-CM

## 2013-07-05 NOTE — Progress Notes (Signed)
Clinical Summary Tyler Moon is an 79 y.o.male last seen in June. He is here with his wife. No change in clinical status. He uses bilateral leg prostheses, a walker at home. Remains functional in his ADLs without angina or palpitations. Denies any falls or bleeding problems.  ECG today shows sinus rhythm with old left bundle-branch block.  We reviewed his cardiac medications.   Allergies  Allergen Reactions  . Cephalexin   . Ciprofloxacin     Current Outpatient Prescriptions  Medication Sig Dispense Refill  . aspirin 81 MG tablet Take 81 mg by mouth daily.        Marland Kitchen docusate sodium (COLACE) 100 MG capsule Take 100 mg by mouth at bedtime.       . enalapril (VASOTEC) 20 MG tablet Take 20 mg by mouth daily.        . furosemide (LASIX) 20 MG tablet Take 20 mg by mouth daily.        Marland Kitchen gabapentin (NEURONTIN) 600 MG tablet Take 600 mg by mouth 2 (two) times daily. And 2 at bedtime      . insulin glargine (LANTUS) 100 UNIT/ML injection Inject 35-45 Units into the skin as directed.       . insulin NPH-insulin regular (HUMULIN 70/30) (70-30) 100 UNIT/ML injection Inject into the skin. 25 UNITS EVERY MORNING & 20 UNITS EVERY EVENING      . insulin regular (NOVOLIN R,HUMULIN R) 100 units/mL injection Inject 7 Units into the skin as directed. IF BS OVER 200 AT LUNCH TIME      . isosorbide mononitrate (IMDUR) 60 MG 24 hr tablet Take 60 mg by mouth daily.        Marland Kitchen NITROGLYCERIN ER PO Take 1 tablet by mouth every 5 (five) minutes x 3 doses as needed.       . ranitidine (ZANTAC) 150 MG tablet Take 150 mg by mouth 2 (two) times daily.      . simvastatin (ZOCOR) 40 MG tablet Take 40 mg by mouth at bedtime.        . sotalol (BETAPACE) 80 MG tablet Take 20 mg by mouth 2 (two) times daily.       Marland Kitchen tiZANidine (ZANAFLEX) 4 MG capsule Take 2 mg by mouth 2 (two) times daily.       . vitamin B-12 (CYANOCOBALAMIN) 1000 MCG tablet Take 1,000 mcg by mouth daily.        . vitamin C (ASCORBIC ACID) 500 MG tablet  Take 500 mg by mouth daily.        Marland Kitchen warfarin (COUMADIN) 5 MG tablet Take 5 mg by mouth as directed. Managed by Dr. Loney Moon       No current facility-administered medications for this visit.    Past Medical History  Diagnosis Date  . Atrial fibrillation   . Coronary atherosclerosis of native coronary artery     Multivessel  . TIA (transient ischemic attack)   . Anemia   . GI bleed   . Diabetes type 2, controlled   . Hyperlipidemia   . Peripheral arterial disease   . Essential hypertension, benign     Past Surgical History  Procedure Laterality Date  . Coronary artery bypass graft  1994  . Transurethral resection of prostate    . Below knee leg amputation      Bilateral    Social History Tyler Moon reports that he has quit smoking. His smoking use included Cigarettes. He smoked 0.00 packs per day. He has  never used smokeless tobacco. Tyler Moon reports that he does not drink alcohol.  Review of Systems No palpitations, no orthopnea or PND. No upper leg edema.  Physical Examination Filed Vitals:   07/05/13 1056  BP: 127/62  Pulse: 67   Filed Weights   07/05/13 1056  Weight: 199 lb 12.8 oz (90.629 kg)    Chronically ill-appearing male, comfortable at rest.  HEENT: Conjunctiva and lids normal, oropharynx with poor dentition.  Neck: Supple, no elevated JVP. No thyromegaly or bruits.  Lungs: Diminished breath sounds, nonlabored.  Cardiac: Regular rate and rhythm, soft systolic murmur, no S3 gallop.  Abdomen: Soft, nontender, bowel sounds present.  Extremities: Status post bilateral below the knee amputations, right side with prosthesis and left side dressed    Problem List and Plan   Atrial fibrillation Continue current medical therapy including sotalol and Coumadin.  CORONARY ATHEROSCLEROSIS NATIVE CORONARY ARTERY No active angina symptoms with multivessel disease status post CABG. ECG with stable left bundle branch block.  HYPERLIPIDEMIA He continues on  Zocor. Keep followup with Dr. Loney Moon.    Tyler Moon, M.D., F.A.C.C.

## 2013-07-05 NOTE — Assessment & Plan Note (Signed)
He continues on Zocor. Keep followup with Dr. Loney Hering.

## 2013-07-05 NOTE — Assessment & Plan Note (Signed)
No active angina symptoms with multivessel disease status post CABG. ECG with stable left bundle branch block.

## 2013-07-05 NOTE — Assessment & Plan Note (Signed)
Continue current medical therapy including sotalol and Coumadin. 

## 2013-07-05 NOTE — Patient Instructions (Signed)

## 2014-01-04 ENCOUNTER — Ambulatory Visit (INDEPENDENT_AMBULATORY_CARE_PROVIDER_SITE_OTHER): Payer: Medicare Other | Admitting: Cardiology

## 2014-01-04 ENCOUNTER — Encounter: Payer: Self-pay | Admitting: Cardiology

## 2014-01-04 VITALS — BP 168/79 | HR 71 | Ht 70.0 in | Wt 199.8 lb

## 2014-01-04 DIAGNOSIS — E785 Hyperlipidemia, unspecified: Secondary | ICD-10-CM

## 2014-01-04 DIAGNOSIS — I4891 Unspecified atrial fibrillation: Secondary | ICD-10-CM

## 2014-01-04 DIAGNOSIS — I2510 Atherosclerotic heart disease of native coronary artery without angina pectoris: Secondary | ICD-10-CM

## 2014-01-04 NOTE — Assessment & Plan Note (Signed)
No active angina symptoms with multivessel disease status post CABG. Continue medical therapy and observation.

## 2014-01-04 NOTE — Patient Instructions (Signed)

## 2014-01-04 NOTE — Assessment & Plan Note (Signed)
Continues on Zocor, followed by Dr. Bluth. 

## 2014-01-04 NOTE — Assessment & Plan Note (Signed)
Continue current medical therapy including sotalol and Coumadin.

## 2014-01-04 NOTE — Progress Notes (Signed)
Clinical Summary [name] is an 80 y.o.male last seen in December 2014. He is here with his wife. Does not report any increasing angina symptoms. His wife says that he is "slowing down." He does not get any regular exercise. Uses a golf cart dry to ride around his property at home.  Lab work from February of this year showed BUN 20, creatinine 1.1, normal LFTs, triglycerides 167, cholesterol 124, HDL 36, LDL 55, potassium 4.9, hemoglobin A1c 9.3, hemoglobin 16.2, platelets 150.  He uses bilateral leg prostheses, a walker at home.  Echocardiogram from September 2011 showed mild LVH with LVEF 45-50%, wall motion abnormalities consistent with ischemic heart disease, MAC with mild mitral regurgitation, sclerotic aortic valve with trace aortic regurgitation, RVSP 16 mmHg.   Allergies  Allergen Reactions  . Cephalexin   . Ciprofloxacin     Current Outpatient Prescriptions  Medication Sig Dispense Refill  . aspirin 81 MG tablet Take 81 mg by mouth daily.        . Cholecalciferol (VITAMIN D-3) 1000 UNITS CAPS Take 1 capsule by mouth daily.      Marland Kitchen. docusate sodium (COLACE) 100 MG capsule Take 100 mg by mouth at bedtime.       . enalapril (VASOTEC) 20 MG tablet Take 20 mg by mouth daily.        Marland Kitchen. gabapentin (NEURONTIN) 600 MG tablet Take 600 mg by mouth 2 (two) times daily. And 2 at bedtime      . insulin glargine (LANTUS) 100 UNIT/ML injection Inject 35-45 Units into the skin as directed.       . insulin NPH-insulin regular (HUMULIN 70/30) (70-30) 100 UNIT/ML injection Inject into the skin. 25 UNITS EVERY MORNING & 20 UNITS EVERY EVENING      . insulin regular (NOVOLIN R,HUMULIN R) 100 units/mL injection Inject 7 Units into the skin as directed. IF BS OVER 200 AT LUNCH TIME      . isosorbide mononitrate (IMDUR) 60 MG 24 hr tablet Take 60 mg by mouth daily.        Marland Kitchen. NITROGLYCERIN ER PO Take 1 tablet by mouth every 5 (five) minutes x 3 doses as needed.       . ranitidine (ZANTAC) 150 MG  tablet Take 150 mg by mouth 2 (two) times daily.      . simvastatin (ZOCOR) 40 MG tablet Take 40 mg by mouth at bedtime.        . sotalol (BETAPACE) 80 MG tablet Take 20 mg by mouth 2 (two) times daily.       Marland Kitchen. tiZANidine (ZANAFLEX) 4 MG capsule Take 2 mg by mouth 2 (two) times daily.       . vitamin B-12 (CYANOCOBALAMIN) 1000 MCG tablet Take 1,000 mcg by mouth daily.        . vitamin C (ASCORBIC ACID) 500 MG tablet Take 500 mg by mouth daily.        Marland Kitchen. warfarin (COUMADIN) 5 MG tablet Take 5 mg by mouth as directed. Managed by Dr. Loney HeringBluth       No current facility-administered medications for this visit.    Past Medical History  Diagnosis Date  . Atrial fibrillation   . Coronary atherosclerosis of native coronary artery     Multivessel  . TIA (transient ischemic attack)   . Anemia   . GI bleed   . Diabetes type 2, controlled   . Hyperlipidemia   . Peripheral arterial disease   . Essential hypertension, benign  Past Surgical History  Procedure Laterality Date  . Coronary artery bypass graft  1994  . Transurethral resection of prostate    . Below knee leg amputation      Bilateral    Social History [name] reports that he has quit smoking. His smoking use included Cigarettes. He smoked 0.00 packs per day. He has never used smokeless tobacco. [name] reports that he does not drink alcohol.  Review of Systems Very hard of hearing. Reports stable appetite. No obvious bleeding problems. No falls. Other systems reviewed and negative.  Physical Examination Filed Vitals:   01/04/14 1321  BP: 168/79  Pulse: 71   Filed Weights   01/04/14 1321  Weight: 199 lb 12.8 oz (90.629 kg)    Chronically ill-appearing male, comfortable at rest.  HEENT: Conjunctiva and lids normal, oropharynx with poor dentition.  Neck: Supple, no elevated JVP. No thyromegaly or bruits.  Lungs: Diminished breath sounds, nonlabored.  Cardiac: Regular rate and rhythm, soft systolic murmur, no S3  gallop.  Abdomen: Soft, nontender, bowel sounds present.  Extremities: Status post bilateral below the knee amputations, right side with prosthesis and left side dressed    Problem List and Plan   CORONARY ATHEROSCLEROSIS NATIVE CORONARY ARTERY No active angina symptoms with multivessel disease status post CABG. Continue medical therapy and observation.  Atrial fibrillation Continue current medical therapy including sotalol and Coumadin.  HYPERLIPIDEMIA Continues on Zocor, followed by Dr. Loney HeringBluth.    Jonelle SidleSamuel G. McDowell, M.D., F.A.C.C.

## 2014-06-28 ENCOUNTER — Ambulatory Visit: Payer: Medicare Other | Admitting: Cardiology

## 2014-07-06 ENCOUNTER — Encounter (HOSPITAL_COMMUNITY): Payer: Self-pay | Admitting: Family Medicine

## 2014-07-06 ENCOUNTER — Inpatient Hospital Stay (HOSPITAL_COMMUNITY)
Admission: AD | Admit: 2014-07-06 | Discharge: 2014-07-09 | DRG: 291 | Disposition: A | Discharge status: Home Health | Payer: Medicare Other | Source: Other Acute Inpatient Hospital | Attending: Internal Medicine | Admitting: Internal Medicine

## 2014-07-06 DIAGNOSIS — I442 Atrioventricular block, complete: Secondary | ICD-10-CM | POA: Diagnosis present

## 2014-07-06 DIAGNOSIS — R791 Abnormal coagulation profile: Secondary | ICD-10-CM | POA: Diagnosis present

## 2014-07-06 DIAGNOSIS — Z87891 Personal history of nicotine dependence: Secondary | ICD-10-CM | POA: Diagnosis not present

## 2014-07-06 DIAGNOSIS — W19XXXA Unspecified fall, initial encounter: Secondary | ICD-10-CM | POA: Diagnosis present

## 2014-07-06 DIAGNOSIS — E1165 Type 2 diabetes mellitus with hyperglycemia: Secondary | ICD-10-CM | POA: Diagnosis present

## 2014-07-06 DIAGNOSIS — Z8701 Personal history of pneumonia (recurrent): Secondary | ICD-10-CM

## 2014-07-06 DIAGNOSIS — N179 Acute kidney failure, unspecified: Secondary | ICD-10-CM | POA: Diagnosis present

## 2014-07-06 DIAGNOSIS — I2510 Atherosclerotic heart disease of native coronary artery without angina pectoris: Secondary | ICD-10-CM | POA: Diagnosis present

## 2014-07-06 DIAGNOSIS — J9601 Acute respiratory failure with hypoxia: Secondary | ICD-10-CM | POA: Diagnosis present

## 2014-07-06 DIAGNOSIS — D696 Thrombocytopenia, unspecified: Secondary | ICD-10-CM | POA: Diagnosis present

## 2014-07-06 DIAGNOSIS — I129 Hypertensive chronic kidney disease with stage 1 through stage 4 chronic kidney disease, or unspecified chronic kidney disease: Secondary | ICD-10-CM | POA: Diagnosis present

## 2014-07-06 DIAGNOSIS — Z7982 Long term (current) use of aspirin: Secondary | ICD-10-CM | POA: Diagnosis not present

## 2014-07-06 DIAGNOSIS — Z823 Family history of stroke: Secondary | ICD-10-CM

## 2014-07-06 DIAGNOSIS — T45515A Adverse effect of anticoagulants, initial encounter: Secondary | ICD-10-CM | POA: Diagnosis present

## 2014-07-06 DIAGNOSIS — Z8673 Personal history of transient ischemic attack (TIA), and cerebral infarction without residual deficits: Secondary | ICD-10-CM | POA: Diagnosis not present

## 2014-07-06 DIAGNOSIS — Z79899 Other long term (current) drug therapy: Secondary | ICD-10-CM | POA: Diagnosis not present

## 2014-07-06 DIAGNOSIS — E1151 Type 2 diabetes mellitus with diabetic peripheral angiopathy without gangrene: Secondary | ICD-10-CM | POA: Diagnosis present

## 2014-07-06 DIAGNOSIS — Z89511 Acquired absence of right leg below knee: Secondary | ICD-10-CM | POA: Diagnosis not present

## 2014-07-06 DIAGNOSIS — T148XXA Other injury of unspecified body region, initial encounter: Secondary | ICD-10-CM | POA: Diagnosis present

## 2014-07-06 DIAGNOSIS — Z794 Long term (current) use of insulin: Secondary | ICD-10-CM

## 2014-07-06 DIAGNOSIS — Z89512 Acquired absence of left leg below knee: Secondary | ICD-10-CM | POA: Diagnosis not present

## 2014-07-06 DIAGNOSIS — I440 Atrioventricular block, first degree: Secondary | ICD-10-CM | POA: Diagnosis present

## 2014-07-06 DIAGNOSIS — N189 Chronic kidney disease, unspecified: Secondary | ICD-10-CM | POA: Diagnosis present

## 2014-07-06 DIAGNOSIS — E1140 Type 2 diabetes mellitus with diabetic neuropathy, unspecified: Secondary | ICD-10-CM | POA: Diagnosis present

## 2014-07-06 DIAGNOSIS — J90 Pleural effusion, not elsewhere classified: Secondary | ICD-10-CM | POA: Diagnosis present

## 2014-07-06 DIAGNOSIS — I482 Chronic atrial fibrillation: Secondary | ICD-10-CM | POA: Diagnosis present

## 2014-07-06 DIAGNOSIS — I4891 Unspecified atrial fibrillation: Secondary | ICD-10-CM | POA: Diagnosis present

## 2014-07-06 DIAGNOSIS — I255 Ischemic cardiomyopathy: Secondary | ICD-10-CM | POA: Diagnosis present

## 2014-07-06 DIAGNOSIS — I5023 Acute on chronic systolic (congestive) heart failure: Secondary | ICD-10-CM | POA: Diagnosis present

## 2014-07-06 DIAGNOSIS — I447 Left bundle-branch block, unspecified: Secondary | ICD-10-CM | POA: Diagnosis present

## 2014-07-06 DIAGNOSIS — Z951 Presence of aortocoronary bypass graft: Secondary | ICD-10-CM | POA: Diagnosis not present

## 2014-07-06 DIAGNOSIS — IMO0002 Reserved for concepts with insufficient information to code with codable children: Secondary | ICD-10-CM | POA: Diagnosis present

## 2014-07-06 DIAGNOSIS — E785 Hyperlipidemia, unspecified: Secondary | ICD-10-CM | POA: Diagnosis present

## 2014-07-06 DIAGNOSIS — R5381 Other malaise: Secondary | ICD-10-CM | POA: Diagnosis present

## 2014-07-06 DIAGNOSIS — R296 Repeated falls: Secondary | ICD-10-CM | POA: Diagnosis present

## 2014-07-06 DIAGNOSIS — Z7901 Long term (current) use of anticoagulants: Secondary | ICD-10-CM | POA: Diagnosis not present

## 2014-07-06 DIAGNOSIS — I252 Old myocardial infarction: Secondary | ICD-10-CM

## 2014-07-06 DIAGNOSIS — S0990XA Unspecified injury of head, initial encounter: Secondary | ICD-10-CM | POA: Diagnosis present

## 2014-07-06 DIAGNOSIS — E875 Hyperkalemia: Secondary | ICD-10-CM | POA: Diagnosis present

## 2014-07-06 DIAGNOSIS — Z8249 Family history of ischemic heart disease and other diseases of the circulatory system: Secondary | ICD-10-CM | POA: Diagnosis not present

## 2014-07-06 DIAGNOSIS — R238 Other skin changes: Secondary | ICD-10-CM

## 2014-07-06 HISTORY — DX: Atrioventricular block, first degree: I44.0

## 2014-07-06 HISTORY — DX: Acquired absence of left leg below knee: Z89.511

## 2014-07-06 HISTORY — DX: Unspecified hearing loss, unspecified ear: H91.90

## 2014-07-06 HISTORY — DX: Atrioventricular block, complete: I44.2

## 2014-07-06 HISTORY — DX: Acquired absence of left leg below knee: Z89.512

## 2014-07-06 HISTORY — DX: Acute myocardial infarction, unspecified: I21.9

## 2014-07-06 LAB — COMPREHENSIVE METABOLIC PANEL
ALK PHOS: 69 U/L (ref 39–117)
ALT: 16 U/L (ref 0–53)
AST: 16 U/L (ref 0–37)
Albumin: 2.8 g/dL — ABNORMAL LOW (ref 3.5–5.2)
Anion gap: 8 (ref 5–15)
BUN: 37 mg/dL — ABNORMAL HIGH (ref 6–23)
CHLORIDE: 107 meq/L (ref 96–112)
CO2: 24 mEq/L (ref 19–32)
Calcium: 8.7 mg/dL (ref 8.4–10.5)
Creatinine, Ser: 1.87 mg/dL — ABNORMAL HIGH (ref 0.50–1.35)
GFR calc Af Amer: 35 mL/min — ABNORMAL LOW (ref >90)
GFR, EST NON AFRICAN AMERICAN: 30 mL/min — AB (ref >90)
Glucose, Bld: 155 mg/dL — ABNORMAL HIGH (ref 70–99)
Potassium: 5.9 mEq/L — ABNORMAL HIGH (ref 3.7–5.3)
Sodium: 139 mEq/L (ref 137–147)
Total Bilirubin: 0.4 mg/dL (ref 0.3–1.2)
Total Protein: 5.9 g/dL — ABNORMAL LOW (ref 6.0–8.3)

## 2014-07-06 LAB — PROTIME-INR
INR: 5.96 — AB (ref 0.00–1.49)
Prothrombin Time: 53.6 seconds — ABNORMAL HIGH (ref 11.6–15.2)

## 2014-07-06 LAB — GLUCOSE, CAPILLARY: Glucose-Capillary: 156 mg/dL — ABNORMAL HIGH (ref 70–99)

## 2014-07-06 LAB — PRO B NATRIURETIC PEPTIDE: Pro B Natriuretic peptide (BNP): 2693 pg/mL — ABNORMAL HIGH (ref 0–450)

## 2014-07-06 MED ORDER — SOTALOL HCL 80 MG PO TABS: 20.0000 mg | ORAL_TABLET | Freq: Two times a day (BID) | ORAL | Status: DC

## 2014-07-06 MED ORDER — GABAPENTIN 600 MG PO TABS
600.0000 mg | ORAL_TABLET | Freq: Two times a day (BID) | ORAL | Status: DC
  Filled 2014-07-06 (×3): qty 1

## 2014-07-06 MED ORDER — SODIUM CHLORIDE 0.9 % IJ SOLN
3.0000 mL | Freq: Two times a day (BID) | INTRAMUSCULAR | Status: DC
  Administered 2014-07-07 – 2014-07-09 (×4): 3 mL via INTRAVENOUS

## 2014-07-06 MED ORDER — GABAPENTIN 400 MG PO CAPS
1200.0000 mg | ORAL_CAPSULE | Freq: Every day | ORAL | Status: DC
  Administered 2014-07-07 – 2014-07-08 (×3): 1200 mg via ORAL
  Filled 2014-07-06 (×5): qty 3

## 2014-07-06 MED ORDER — ACETAMINOPHEN 650 MG RE SUPP: 650.0000 mg | Freq: Four times a day (QID) | RECTAL | Status: DC | PRN

## 2014-07-06 MED ORDER — FAMOTIDINE 20 MG PO TABS
20.0000 mg | ORAL_TABLET | Freq: Every day | ORAL | Status: DC
  Administered 2014-07-07 – 2014-07-09 (×4): 20 mg via ORAL
  Filled 2014-07-06 (×4): qty 1

## 2014-07-06 MED ORDER — TIZANIDINE HCL 4 MG PO TABS
2.0000 mg | ORAL_TABLET | Freq: Two times a day (BID) | ORAL | Status: DC
  Administered 2014-07-07 – 2014-07-09 (×6): 2 mg via ORAL
  Filled 2014-07-06 (×9): qty 1

## 2014-07-06 MED ORDER — INSULIN ASPART 100 UNIT/ML ~~LOC~~ SOLN
0.0000 [IU] | Freq: Three times a day (TID) | SUBCUTANEOUS | Status: DC
  Administered 2014-07-07: 5 [IU] via SUBCUTANEOUS
  Administered 2014-07-07 – 2014-07-08 (×2): 8 [IU] via SUBCUTANEOUS
  Administered 2014-07-08: 5 [IU] via SUBCUTANEOUS
  Administered 2014-07-08: 2 [IU] via SUBCUTANEOUS
  Administered 2014-07-09: 5 [IU] via SUBCUTANEOUS
  Administered 2014-07-09: 2 [IU] via SUBCUTANEOUS

## 2014-07-06 MED ORDER — SIMVASTATIN 20 MG PO TABS
40.0000 mg | ORAL_TABLET | Freq: Every day | ORAL | Status: DC
  Administered 2014-07-07 – 2014-07-08 (×3): 40 mg via ORAL
  Filled 2014-07-06 (×3): qty 2

## 2014-07-06 MED ORDER — ONDANSETRON HCL 4 MG/2ML IJ SOLN: 4.0000 mg | Freq: Four times a day (QID) | INTRAMUSCULAR | Status: DC | PRN

## 2014-07-06 MED ORDER — ONDANSETRON HCL 4 MG PO TABS: 4.0000 mg | ORAL_TABLET | Freq: Four times a day (QID) | ORAL | Status: DC | PRN

## 2014-07-06 MED ORDER — ACETAMINOPHEN 325 MG PO TABS: 650.0000 mg | ORAL_TABLET | Freq: Four times a day (QID) | ORAL | Status: DC | PRN

## 2014-07-06 MED ORDER — ISOSORBIDE MONONITRATE ER 60 MG PO TB24
60.0000 mg | ORAL_TABLET | Freq: Every day | ORAL | Status: DC
  Administered 2014-07-07 – 2014-07-09 (×3): 60 mg via ORAL
  Filled 2014-07-06 (×3): qty 1

## 2014-07-06 MED ORDER — ASPIRIN 81 MG PO CHEW
81.0000 mg | CHEWABLE_TABLET | Freq: Every day | ORAL | Status: DC
  Administered 2014-07-07 – 2014-07-09 (×3): 81 mg via ORAL
  Filled 2014-07-06 (×4): qty 1

## 2014-07-06 MED ORDER — INSULIN GLARGINE 100 UNIT/ML ~~LOC~~ SOLN
23.0000 [IU] | Freq: Every day | SUBCUTANEOUS | Status: DC
  Administered 2014-07-07 – 2014-07-09 (×3): 23 [IU] via SUBCUTANEOUS
  Filled 2014-07-06 (×5): qty 0.23

## 2014-07-06 MED ORDER — SODIUM CHLORIDE 0.9 % IV SOLN
INTRAVENOUS | Status: DC
  Administered 2014-07-06 – 2014-07-08 (×4): via INTRAVENOUS

## 2014-07-06 MED ORDER — ENALAPRIL MALEATE 5 MG PO TABS
20.0000 mg | ORAL_TABLET | Freq: Every day | ORAL | Status: DC
  Administered 2014-07-07 – 2014-07-09 (×3): 20 mg via ORAL
  Filled 2014-07-06 (×3): qty 4

## 2014-07-06 MED ORDER — MORPHINE SULFATE 2 MG/ML IJ SOLN: 2.0000 mg | INTRAMUSCULAR | Status: DC | PRN

## 2014-07-06 MED ORDER — GABAPENTIN 300 MG PO CAPS
600.0000 mg | ORAL_CAPSULE | Freq: Two times a day (BID) | ORAL | Status: DC
  Administered 2014-07-07 – 2014-07-09 (×5): 600 mg via ORAL
  Filled 2014-07-06 (×5): qty 2

## 2014-07-06 NOTE — Progress Notes (Signed)
Pharmacy Note:  Coumadin Protocol   Estimated Creatinine Clearance: 29.4 mL/min (by C-G formula based on Cr of 1.87).   Allergies  Allergen Reactions  . Cephalexin   . Ciprofloxacin     Filed Vitals:   07/06/14 2241  BP:   Pulse:   Temp: 97.6 F (36.4 C)  Resp:     Anti-infectives    None      Plan: No coumadin tonight (elevated INR) INR daily  Wayland DenisHall, Valora Norell A, [facility] MsRPH 07/06/2014 10:57 PM

## 2014-07-06 NOTE — Progress Notes (Signed)
Dr. Konrad DoloresMerrell notified that patient was on the floor at beginning of shift. Family stated patient experienced neck and back pain related to arthritis, patient was sleeping upon observation. Doctor stated he would be up to see family and patient to put in orders. Will continue to monitor patient status.  Starla LinkLewis, Tahje Borawski J, RN

## 2014-07-06 NOTE — Progress Notes (Signed)
CRITICAL VALUE ALERT  Critical value received:  INR 5.96  Date of notification:  07/06/2014  Time of notification:  2213  Critical value read back:Yes.    Nurse who received alert:  Shelbie Hutchingominique Kortney Potvin  MD notified (1st page):  Schorr  Time of first page:  2216  MD notified (2nd page): Schorr  Time of second page:2228  Responding MD:  Schorr  Time MD responded:  2229

## 2014-07-06 NOTE — H&P (Signed)
[facility] History and Physical  Tyler Moon UJW:[id number] DOB: 09/10/1922 DOA: 07/06/2014  Referring physician: Dr. Coralie Carpenandice Bradley [facility] PCP: Ernestine ConradBLUTH, KIRK, MD   Chief Complaint: Fall   HPI: Tyler DolphinHoward R Debo is a 80 y.o. male  Pt fell around 13:00.   On 07/05/14 pt stated he was feelin poorly. CBG 378 last night and pt complaining of HA and w/ BP to 190/89. Associated w/ agitation. Fell from wheelchair and hit head on bed post. EMS evaluated pt and placed in bed to rest. Home nurse today noted that his INR was 7.3 and O2 was low. EMS was called out again and came to the home [facility]. Currently undergoing treatment for cellulitis for R stump cellulitis. Pt has been on ABX since September for this infection. Family now concerned that L stump is infected. Also recovering from pneumonia from earlier in the fall. Decreased activity level over the past few months. Decreased appetite during this time.   Recent dysphagia and seeing GI for this. Pt scheduled for outpt EGD. Gi [place].   Review of Systems:  Constitutional:  Nonight sweats, Fevers, chills,  HEENT:  No Tooth/dental problems,Sore throat,  No sneezing, itching, ear ache, nasal congestion, post nasal drip,  Cardio-vascular:  No chest pain, Orthopnea, PND, swelling in lower extremities, anasarca, dizziness, palpitations  GI:  No heartburn, indigestion, abdominal pain, nausea, vomiting, diarrhea, change in bowel habits, loss of appetite  Resp:  No shortness of breath with exertion or at rest. No excess mucus, no productive cough, No non-productive cough, No coughing up of blood.No change in color of mucus.No wheezing.No chest wall deformity  Skin:  Per HPI  GU:  no dysuria, change in color of urine, no urgency or frequency. No flank pain.  Musculoskeletal:  No joint pain or swelling. No decreased range of motion. No back pain.  Psych:  No change in mood or affect.  No depression or anxiety. No memory loss.   Past Medical History  Diagnosis Date  . Atrial fibrillation   . Coronary atherosclerosis of native coronary artery     Multivessel  . TIA (transient ischemic attack)   . Anemia   . GI bleed   . Diabetes type 2, controlled   . Hyperlipidemia   . Peripheral arterial disease   . Essential hypertension, benign   . Partial deafness   . MI (myocardial infarction)    Past Surgical History  Procedure Laterality Date  . Coronary artery bypass graft  1994  . Transurethral resection of prostate    . Below knee leg amputation      Bilateral   Social History:  reports that he has quit smoking. His smoking use included Cigarettes. He smoked 0.00 packs per day. He has never used smokeless tobacco. He reports that he does not drink alcohol or use illicit drugs.  Allergies  Allergen Reactions  . Cephalexin   . Ciprofloxacin     Family History  Problem Relation Age of Onset  . Heart disease Mother   . Stroke Father      Prior to Admission medications   Medication Sig Start Date End Date Taking? Authorizing Provider  aspirin 81 MG tablet Take 81 mg by mouth daily.      Historical Provider, MD  Cholecalciferol (VITAMIN D-3) 1000 UNITS CAPS Take 1 capsule by mouth daily.    Historical Provider, MD  docusate sodium (COLACE) 100 MG capsule Take 100 mg by  mouth at bedtime.     Historical Provider, MD  enalapril (VASOTEC) 20 MG tablet Take 20 mg by mouth daily.      Historical Provider, MD  gabapentin (NEURONTIN) 600 MG tablet Take 600 mg by mouth 2 (two) times daily. And 2 at bedtime    Historical Provider, MD  insulin glargine (LANTUS) 100 UNIT/ML injection Inject 35-45 Units into the skin as directed.     Historical Provider, MD  insulin NPH-insulin regular (HUMULIN 70/30) (70-30) 100 UNIT/ML injection Inject into the skin. 25 UNITS EVERY MORNING & 20 UNITS EVERY EVENING    Historical Provider, MD  insulin regular (NOVOLIN R,HUMULIN R) 100 units/mL  injection Inject 7 Units into the skin as directed. IF BS OVER 200 AT LUNCH TIME    Historical Provider, MD  isosorbide mononitrate (IMDUR) 60 MG 24 hr tablet Take 60 mg by mouth daily.      Historical Provider, MD  NITROGLYCERIN ER PO Take 1 tablet by mouth every 5 (five) minutes x 3 doses as needed.     Historical Provider, MD  ranitidine (ZANTAC) 150 MG tablet Take 150 mg by mouth 2 (two) times daily.    Historical Provider, MD  simvastatin (ZOCOR) 40 MG tablet Take 40 mg by mouth at bedtime.      Historical Provider, MD  sotalol (BETAPACE) 80 MG tablet Take 20 mg by mouth 2 (two) times daily.     Historical Provider, MD  tiZANidine (ZANAFLEX) 4 MG capsule Take 2 mg by mouth 2 (two) times daily.     Historical Provider, MD  vitamin B-12 (CYANOCOBALAMIN) 1000 MCG tablet Take 1,000 mcg by mouth daily.      Historical Provider, MD  vitamin C (ASCORBIC ACID) 500 MG tablet Take 500 mg by mouth daily.      Historical Provider, MD  warfarin (COUMADIN) 5 MG tablet Take 5 mg by mouth as directed. Managed by Dr. Loney HeringBluth    Historical Provider, MD   Physical Exam: Filed Vitals:   07/06/14 1830 07/06/14 1900  BP: 119/58   Pulse: 60   Temp: 98.3 F (36.8 C)   TempSrc: Oral   Resp: 18   Height:  6' (1.829 m)  Weight:  83.2 kg (183 lb 6.8 oz)  SpO2: 99%     Wt Readings from Last 3 Encounters:  07/06/14 83.2 kg (183 lb 6.8 oz)  01/04/14 90.629 kg (199 lb 12.8 oz)  07/05/13 90.629 kg (199 lb 12.8 oz)    General:  Appears calm and comfortable Eyes:  PERRL, normal lids, irises & conjunctiva ENT: dry mucus membranes Neck:  no LAD, masses or thyromegaly Cardiovascular:  RRR, irregularly irregular. No LE edema (BKAs bilat) Respiratory:  CTA bilaterally, no w/r/r. Normal respiratory effort. Abdomen:  soft, ntnd Skin:  Numerous soft scabs on bilat stumps R>L w/ minimal sourounding erythema w/o induration or purulence, minimally ttp  Musculoskeletal: Bilat BKA, grossly normal tone  BUE/BLE Psychiatric:  grossly normal mood and affect, speech fluent and appropriate Neurologic:  grossly non-focal.Hard of hearing          Labs on Admission:  Basic Metabolic Panel: No results for input(s): NA, K, CL, CO2, GLUCOSE, BUN, CREATININE, CALCIUM, MG, PHOS in the last 168 hours. Liver Function Tests: No results for input(s): AST, ALT, ALKPHOS, BILITOT, PROT, ALBUMIN in the last 168 hours. No results for input(s): LIPASE, AMYLASE in the last 168 hours. No results for input(s): AMMONIA in the last 168 hours. CBC: No results for input(s): WBC,  NEUTROABS, HGB, HCT, MCV, PLT in the last 168 hours. Cardiac Enzymes: No results for input(s): CKTOTAL, CKMB, CKMBINDEX, TROPONINI in the last 168 hours.  BNP (last 3 results) No results for input(s): PROBNP in the last 8760 hours. CBG: No results for input(s): GLUCAP in the last 168 hours.  Radiological Exams on Admission: No results found.  EKG: . Ordered   Admission labs at [facility] are significant for   BUN 41 Cr 1.84 GFR 35 K 5.8 INR 5.6 at 10:48  This am gluc 122 WBC 5.8 Hgb 13.5 Hct 42.3 INR 5.4   Hgb A1c 8.4  CXR showing bibasilar infiltrates - pneumo vs edema CT head and C-spine Impressions: No acute intracranial abnormalities. No fracture. Moderate to large L pleural effusion.       Assessment/Plan Principal Problem:   Fall Active Problems:   CORONARY ATHEROSCLEROSIS NATIVE CORONARY ARTERY   Atrial fibrillation   Diabetes type 2, uncontrolled   S/P bilateral BKA (below knee amputation)   Multiple wounds of skin   AKI (acute kidney injury)   Elevated INR   Head trauma   Pleural effusion  80yo M presenting from [facility] hospital w/ PMH of A fib, CAD, s/p CABG 1994, TIA, GI bleeds, DM 2, HLD, PAD, MI, HTN, presenting after fall w/ supratherapeutic INR (7).   Fall: sounds mechanical based on description. Pt evaluated at [facility] for fall from wheelchair. Pt struck his head adn developed a  superficial scalp and ear bleed. CT scan at [facility] was w/o evidence of intracranial bleed. Pts INR at time of presentation was 5.4. Pt maintains his normal state of mind per family. Pt was transferred to our facility per the demends of the family due to concern for pts care. Family was not specific in reasons why. Anticipate par of the pts problem is ongoing physical deconditioning as indicated by the family.  - Admit - PT/OT - CSW for placement recommendations - Neuro checks Q4 x 12 hours - tele  AKI: no h/o kidney disease. Cr 1.85 on admission to [facility]. Reports from home of dehydration and anticipate pre-renal etiology. No previous labs to compare - Bmet - IVF NS 138ml/hr  Pleural effusions: Noted on CT. Per family pt w/ multiple rounds of pneumonia and on near continual ABX since September. Pt is AFVSS and w/o white count or WBC elevation. Favor holding ABX at this time. Pt given Vanc and Gent at [place]. Unsure if CHF has been considered.  - Monitor - BNP  Chronic skin wounds: pt reports months of constant leg infections near pts BKA amputation stumps. Reports being told that "he is infected from his R stump all the way to his privates w/ doughy skin." No mention of infection in [facility] documents other than superficial skin lesions. Pt does have what appear to be chronic slow healing leg lesions. These are likely slow to heal and easily reinfected secondary to DM, poor vasculature, and edema, and continued trauma. No need for ABX at this time as no induration, or purulence w/ only mild intermittent ttp. Redness present likely from chronic slow healing - Wound care consult  HTN: stable - continue enalapril, Imdur, sotalol  Afib: chronic, on anticoagulation. Rate controlled a time of admission - continue sotalol - Notify [place] in the am. Pt w/ appt to see him next week and family states he is to be made aware tha tpt is here.   Hyperkalemia: 5.8 on admission. No mention of  corrective measures - BMET - EKG.  DM2: A1c 8.5 at [facility]. Pt on lantus, Humulin 70/30, and Novolog. Family seems frustrated by this regimen - A1c  - Lantus at half dose (23 units QAM) - SSI   HLD:  - continue statin  Neuropathy:  - continue neurontin  Code Status: FULL DVT Prophylaxis: Full dose anticoagulation w/ Warfarin for Afib Family Communication: wife, daughters, and friend  Disposition Plan: pending imprvoement    MERRELL, DAVID J Family Medicine [facility] [email or web address] Password [email or web address]

## 2014-07-07 ENCOUNTER — Inpatient Hospital Stay (HOSPITAL_COMMUNITY): Payer: Medicare Other

## 2014-07-07 DIAGNOSIS — I059 Rheumatic mitral valve disease, unspecified: Secondary | ICD-10-CM

## 2014-07-07 DIAGNOSIS — N183 Chronic kidney disease, stage 3 (moderate): Secondary | ICD-10-CM

## 2014-07-07 DIAGNOSIS — I5041 Acute combined systolic (congestive) and diastolic (congestive) heart failure: Secondary | ICD-10-CM

## 2014-07-07 DIAGNOSIS — R001 Bradycardia, unspecified: Secondary | ICD-10-CM

## 2014-07-07 LAB — COMPREHENSIVE METABOLIC PANEL
ALK PHOS: 72 U/L (ref 39–117)
ALT: 14 U/L (ref 0–53)
ANION GAP: 9 (ref 5–15)
AST: 16 U/L (ref 0–37)
Albumin: 2.9 g/dL — ABNORMAL LOW (ref 3.5–5.2)
BILIRUBIN TOTAL: 0.5 mg/dL (ref 0.3–1.2)
BUN: 32 mg/dL — AB (ref 6–23)
CHLORIDE: 110 meq/L (ref 96–112)
CO2: 23 meq/L (ref 19–32)
Calcium: 8.6 mg/dL (ref 8.4–10.5)
Creatinine, Ser: 1.77 mg/dL — ABNORMAL HIGH (ref 0.50–1.35)
GFR, EST AFRICAN AMERICAN: 37 mL/min — AB (ref >90)
GFR, EST NON AFRICAN AMERICAN: 32 mL/min — AB (ref >90)
GLUCOSE: 113 mg/dL — AB (ref 70–99)
POTASSIUM: 5.8 meq/L — AB (ref 3.7–5.3)
Sodium: 142 mEq/L (ref 137–147)
Total Protein: 6 g/dL (ref 6.0–8.3)

## 2014-07-07 LAB — PROTIME-INR
INR: 4.77 — ABNORMAL HIGH (ref 0.00–1.49)
Prothrombin Time: 45.1 seconds — ABNORMAL HIGH (ref 11.6–15.2)

## 2014-07-07 LAB — CBC
HCT: 40.6 % (ref 39.0–52.0)
Hemoglobin: 12.6 g/dL — ABNORMAL LOW (ref 13.0–17.0)
MCH: 31.4 pg (ref 26.0–34.0)
MCHC: 31 g/dL (ref 30.0–36.0)
MCV: 101.2 fL — ABNORMAL HIGH (ref 78.0–100.0)
Platelets: 139 10*3/uL — ABNORMAL LOW (ref 150–400)
RBC: 4.01 MIL/uL — ABNORMAL LOW (ref 4.22–5.81)
RDW: 13.5 % (ref 11.5–15.5)
WBC: 4.2 10*3/uL (ref 4.0–10.5)

## 2014-07-07 LAB — GLUCOSE, CAPILLARY
GLUCOSE-CAPILLARY: 168 mg/dL — AB (ref 70–99)
Glucose-Capillary: 106 mg/dL — ABNORMAL HIGH (ref 70–99)
Glucose-Capillary: 193 mg/dL — ABNORMAL HIGH (ref 70–99)
Glucose-Capillary: 253 mg/dL — ABNORMAL HIGH (ref 70–99)
Glucose-Capillary: 204 mg/dL — ABNORMAL HIGH (ref 70–99)

## 2014-07-07 LAB — HEMOGLOBIN A1C
Hgb A1c MFr Bld: 8.3 % — ABNORMAL HIGH (ref <5.7)
Mean Plasma Glucose: 192 mg/dL — ABNORMAL HIGH (ref <117)

## 2014-07-07 MED ORDER — TIZANIDINE HCL 4 MG PO TABS
ORAL_TABLET | ORAL | Status: AC
  Filled 2014-07-07: qty 1

## 2014-07-07 MED ORDER — NON FORMULARY: 20.0000 mg | Freq: Two times a day (BID) | Status: DC

## 2014-07-07 MED ORDER — WARFARIN - PHARMACIST DOSING INPATIENT: Status: DC

## 2014-07-07 MED ORDER — SODIUM POLYSTYRENE SULFONATE 15 GM/60ML PO SUSP
15.0000 g | Freq: Once | ORAL | Status: AC
  Administered 2014-07-07: 15 g via ORAL
  Filled 2014-07-07: qty 60

## 2014-07-07 NOTE — Progress Notes (Signed)
Patient's heart rate was ranging between 25-60 bpm. Central telemetry stated that patient was in second degree type two heart block. Dr. Lama was notified. Dr. Lama came to unit to see patient and EKG strip. MD ordered cardiologist consult, patient currently asymptomatic, will continue to monitor.  Catarino Vold J, RN  

## 2014-07-07 NOTE — Progress Notes (Signed)
Patients heart rate dropped to 27 during the night, but running 76 at this time. Discussed with Dr. Ardyth HarpsHernandez. Will hold sotalol for now and given vasotec and imdur.

## 2014-07-07 NOTE — Evaluation (Signed)
Occupational Therapy Evaluation Patient Details Name: Tyler DolphinHoward R Moon MRN: [id number] DOB: 08/02/1922 Today's Date: 07/07/2014    History of Present Illness On 07/05/14 pt stated he was feelin poorly. CBG 378 last night and pt complaining of HA and w/ BP to 190/89. Associated w/ agitation. Fell from wheelchair and hit head on bed post. EMS evaluated pt and placed in bed to rest. Home nurse today noted that his INR was 7.3 and O2 was low. EMS was called out again and came to the [facility]. Currently undergoing treatment for cellulitis for R stump cellulitis. Pt has been on ABX since September for this infection. Family now concerned that L stump is infected. Also recovering from pneumonia from earlier in the fall. Decreased activity level over the past few months. Decreased appetite during this time.    Clinical Impression   Pt is presenting to acute OT with above situation.  [facility] M[facility] strength in BUE, but fatigue easily.  Pt will require improved activity tolerance for engagement in transfer techniques.  Pt's daughter reports that pt used slide board for years, just not recently, and that pt has had overall decline in ADL functioning over the past 4 months.  Pt will benefit from continued OT services for improvement of activity tolerance, ADL status, and functional transfers.  Recommend SNF OT at this time.    Follow Up Recommendations  SNF    Equipment Recommendations  None recommended by OT    Recommendations for Other Services       Precautions / Restrictions Precautions Precautions: Fall Precaution Comments: (B) BKA Restrictions Weight Bearing Restrictions: Yes RLE Weight Bearing: Non weight bearing LLE Weight Bearing: Non weight bearing Other Position/Activity Restrictions: NWB secondary to wound from prosthetics      Mobility Bed Mobility Overal bed mobility: Needs Assistance Bed Mobility: Supine to Sit;Sit to Supine     Supine to sit: HOB  elevated;Min guard Sit to supine: HOB elevated;Min guard   General bed mobility comments: Pt unable to bridge/scoot up in bed, required assist x2   Transfers                 General transfer comment: Explained to pt/family techniques for slide board transfer, though due to set up of W/C in hospital unable to attempt at this time.     Balance Overall balance assessment: Needs assistance Sitting-balance support: Feet unsupported;Single extremity supported Sitting balance-Leahy Scale: Good Sitting balance - Comments: Able to maintain balance at EOB against moderate pertebations                                    ADL Overall ADL's : Needs assistance/impaired         Upper Body Bathing: Maximal assistance   Lower Body Bathing: Maximal assistance   Upper Body Dressing : Maximal assistance   Lower Body Dressing: Maximal assistance   Toilet Transfer: Maximal assistance;Total assistance             General ADL Comments: pt requries max assist for ADLs at this time, due to NWB status on BLE.     Vision                     Perception     Praxis      Pertinent Vitals/Pain Pain Assessment: No/denies pain Faces Pain Scale: Hurts little more Pain Location: Rt side  of midback Pain Intervention(s): Limited activity within patient's tolerance;Repositioned     Hand Dominance Right   Extremity/Trunk Assessment Upper Extremity Assessment Upper Extremity Assessment: Overall WFL for tasks assessed;Generalized weakness (MMT revealed good strength.  Family reports poor endurarance and activity tolerance.)   Lower Extremity Assessment Lower Extremity Assessment: Generalized weakness;LLE deficits/detail;RLE deficits/detail RLE Deficits / Details: BKA LLE Deficits / Details: BKA       Communication Communication Communication: HOH (improved hearing in R ear)   Cognition Arousal/Alertness: Awake/alert Behavior During Therapy: WFL for tasks  assessed/performed Overall Cognitive Status: Within Functional Limits for tasks assessed                     General Comments       Exercises       Shoulder Instructions      Home Living Family/patient expects to be discharged to:: Skilled nursing facility Living Arrangements: Spouse/significant other;Other relatives Available Help at Discharge: Available PRN/intermittently;Available 24 hours/day Type of Home: House Home Access: Ramped entrance     Home Layout: One level     Bathroom Shower/Tub: Tub/shower unit         Home Equipment: Bedside commode;Walker - 4 wheels;Shower seat;Wheelchair - manual;Cane - single point;Tub bench   Additional Comments: Pt lives in a single story home with a ramped entryway with his wife.  Family very supportive and able to assist PRN.  DME : (B) BKA prosthetics, BSC x2, std cane x2, shower bench, handicap height toilet, manual W/C      Prior Functioning/Environment Level of Independence: Independent with assistive device(s);Needs assistance    ADL's / Homemaking Assistance Needed: Prior to August of this year, pt was requiring min assist with ADLs.  pt has had declined in strength and function this fall, resulting in max assist for dressing and bathing tasks.  Pt does not preform IADLs.   Comments: Per family, pt was able to transfer to/from W/C with (B) prosthetics on and was able to self propel manual W/C. Descreased abilites in transfers since August, with decresed strength and increased pain from prosthesis.    OT Diagnosis: Generalized weakness   OT Problem List: Decreased strength;Decreased activity tolerance (Decreased ADL status)   OT Treatment/Interventions: Self-care/ADL training;Therapeutic exercise;Patient/family education;Energy conservation;DME and/or AE instruction;Therapeutic activities;Balance training    OT Goals(Current goals can be found in the care plan section) Acute Rehab OT Goals Patient Stated Goal: be  safe to go home OT Goal Formulation: With patient/family Time For Goal Achievement: 07/21/14 Potential to Achieve Goals: Fair  OT Frequency: Min 2X/week   Barriers to D/C:            Co-evaluation              End of Session    Activity Tolerance: Patient tolerated treatment well Patient left: in bed;with call bell/phone within reach;with family/visitor present   Time: [id number] OT Time Calculation (min): 25 min Charges:  OT General Charges $OT Visit: 1 Procedure OT Evaluation $Initial OT Evaluation Tier I: 1 Procedure G-Codes:     Marry GuanMarie Rawlings Quin Mcpherson, MS, OTR/L [facility] [phone] 07/07/2014, 3:22 PM

## 2014-07-07 NOTE — Consult Note (Signed)
WOC wound consult note Reason for Consult: Chronic non-healing wounds on bilateral stumps following amputation. Wound type: Chronic arterial insufficiency vs surgical Pressure Ulcer POA: No Measurement:Right stump, medial aspect:  1.7cm x 0.9cm x 0.1cm (Partial Thickness) dried areas of serum (scab) with minimal moist pink wound base eveident, Right stump sub-patellar region:  0.4cm round with dried serum (scab) covering (Stable) Wound bed:AS described above. Drainage (amount, consistency, odor) None Periwound:Intact, dry Dressing procedure/placement/frequency: I will recommend elevation and twice daily cleansing and moisturizing with our house products. WOC nursing team will not follow, but will remain available to this patient, the nursing and medical team.  Please re-consult if needed. These areas should resurface with regular skin conditioning and elevation. Thanks, Ladona MowLaurie Shilee Biggs, MSN, RN, GNP, [facility], CWON-AP [phone])

## 2014-07-07 NOTE — Progress Notes (Signed)
Patient is currently in second degree type two heart block. Dr. Sharl MaLama was notified. Since patient is asymptomatic and resting cardiologist will follow up with him in the morning. Per doctor request, if patient stays in heart block and becomes symptomatic will consider transfer. Patient currently resting and asymptomatic, will continue to monitor patient status.   Starla LinkLewis, Simra Fiebig J, RN

## 2014-07-07 NOTE — Clinical Social Work Placement (Signed)
Clinical Social Work Department CLINICAL SOCIAL WORK PLACEMENT NOTE 07/07/2014  Patient:  Tyler Moon,Tyler Moon  Account Number:  [id number] Admit date:  07/06/2014  Clinical Social Worker:  Derenda FennelKARA Logann Whitebread, LCSW  Date/time:  07/07/2014 03:53 PM  Clinical Social Work is seeking post-discharge placement for this patient at the following level of care:   SKILLED NURSING   (*CSW will update this form in Epic as items are completed)   07/07/2014  Patient/family provided with [facility] Department of Clinical Social Work's list of facilities offering this level of care within the geographic area requested by the patient (or if unable, by the patient's family).  07/07/2014  Patient/family informed of their freedom to choose among providers that offer the needed level of care, that participate in Medicare, Medicaid or managed care program needed by the patient, have an available bed and are willing to accept the patient.  07/07/2014  Patient/family informed of [facility]' ownership interest in [facility], as well as of the fact that they are under no obligation to receive care at this facility.  PASARR submitted to EDS on 07/07/2014 PASARR number received on 07/07/2014  FL2 transmitted to all facilities in geographic area requested by pt/family on   FL2 transmitted to all facilities within larger geographic area on   Patient informed that his/her managed care company has contracts with or will negotiate with  certain facilities, including the following:     Patient/family informed of bed offers received:   Patient chooses bed at  Physician recommends and patient chooses bed at    Patient to be transferred to  on   Patient to be transferred to facility by  Patient and family notified of transfer on  Name of family member notified:    The following physician request were entered in Epic:   Additional Comments:  Derenda FennelKara Wilbert Schouten, LCSW [phone]

## 2014-07-07 NOTE — Clinical Social Work Psychosocial (Signed)
Clinical Social Work Department BRIEF PSYCHOSOCIAL ASSESSMENT 07/07/2014  Patient:  Tyler Moon,Tyler Moon     Account Number:  [id number]     Admit date:  07/11/2010  Clinical Social Worker:  Nancie NeasSTULTZ,Marri Mcneff, LCSW  Date/Time:  07/07/2014 04:14 PM  Referred by:  Physician  Date Referred:  07/07/2014 Referred for  SNF Placement   Other Referral:   Interview type:  Family Other interview type:   Eber Jonesarolyn- daughter    PSYCHOSOCIAL DATA Living Status:  FAMILY Admitted from facility:   Level of care:   Primary support name:  [name]/Michael Primary support relationship to patient:  CHILD, ADULT Degree of support available:   supportive    CURRENT CONCERNS Current Concerns  Post-Acute Placement   Other Concerns:    SOCIAL WORK ASSESSMENT / PLAN CSW attempted to meet with pt, but having echo done. CSW spoke with pt's daughter, Eber JonesCarolyn who reports that [facility] being [facility] several hours. Pt lives with his wife and has 4 children, 3 who live locally. Pt's son, Casimiro NeedleMichael is HCPOA but has been in an all day meeting. Eber JonesCarolyn said that pt fell yesterday and home health RN called EMS. She explains that pt has been in constant pain, but never complains. He has had "one thing after the other" medically, starting in 1994 with Moon massive heart attack. Pt has bilateral BKA. She states that they have managed at home, but pt falls Moon lot. PT evaluated pt today and recommendation is for SNF. CSW discussed placement process and provided SNF list. Eber JonesCarolyn is aware of Medicare criteria for placement. She is worried about what pt would choose if needed as her parents would not want to pay privately. Eber JonesCarolyn feels strongly that pt needs to go to SNF, but recognizes that this will be his decision. CSW agreed to follow up in AM with pt and family to discuss further.   Assessment/plan status:  Psychosocial Support/Ongoing Assessment of Needs Other assessment/  plan:   Information/referral to community resources:   SNF list    PATIENT'S/FAMILY'S RESPONSE TO PLAN OF CARE: Pt not available at time of visit. CSW will follow up.       Derenda FennelKara Jood Retana, Kentucky[place]CSW [phone]

## 2014-07-07 NOTE — Care Management Note (Addendum)
    Page 1 of 2   07/08/2014     11:52:54 AM CARE MANAGEMENT NOTE 07/08/2014  Patient:  Tyler Moon,Tyler R   Account Number:  [id number]  Date Initiated:  07/07/2014  Documentation initiated by:  Sharrie RothmanBLACKWELL,Malikye Reppond C  Subjective/Objective Assessment:   Pt admitted from home with bradycardia and elevated PT/INR. Pt lives with his wife but family is concerned that pt may need placement. Pt has a cane, walker, bilateral prosthesis, lift chair, shower chair, BSC, and w/c.     Action/Plan:   PT is recommending SNF. Will continue to follow for discharge planning needs. CSW is aware of referral.   Anticipated DC Date:  07/09/2014   Anticipated DC Plan:  SKILLED NURSING FACILITY  In-house referral  Clinical Social Worker      DC Planning Services  CM consult      [facility]  DURABLE MEDICAL EQUIPMENT  Resumption Of Svcs/PTA Provider   Choice offered to / List presented to:  C-4 Adult Children   DME arranged  OTHER - SEE COMMENT      DME agency  [facility]     HH arranged  HH-1 RN  HH-2 PT  HH-4 NURSE'S AIDE      Status of service:  Completed, signed off Medicare Important Message given?  YES (If response is "NO", the following Medicare IM given date fields will be blank) Date Medicare IM given:  07/08/2014 Medicare IM given by:   Date Additional Medicare IM given:   Additional Medicare IM given by:    Discharge Disposition:  HOME W HOME HEALTH SERVICES  Per UR Regulation:    If discussed at Long Length of Stay Meetings, dates discussed:    Comments:  07/08/14 1150 Tyler Queenammy Gurfateh Mcclain, RN BSN CM Family and pt has decided to return home and not SNF at discharge. Pt HH will resume with [facility] (per family choice). RN, PT and aide will start within 48 hours of discharge. Tyler [facility] aware and will collect pts information from the chart. [facility] Will check pt to see if she qualifies for home O2 and if so will also  [facility]. Anticipate d/c within 24 hours of discharge.  07/07/14 1420 Tyler Queenammy Latwan Luchsinger, RN BSN CM

## 2014-07-07 NOTE — Consult Note (Addendum)
CARDIOLOGY CONSULT NOTE  Patient ID: Tyler Moon MRN: [id number] DOB/AGE: 09/02/1922 80 y.o.  Admit date: 07/06/2014 Primary Physician [name], MD  Reason for Consultation: "heart block"  HPI: The patient is an 80 yr old male who follows with Dr. Diona Browner in the cardiology clinic. He has a history of CABG, atrial fibrillation (maintained on sotalol and warfarin), diabetes mellitus, ischemic cardiomyopathy, and hyperlipidemia. He has been living independently at home as per several family members whom I spoke with, including daughters and granddaughter. He apparently tried to stand up from a wheelchair wearing only one prosthetic, and hit his head on a post. BP 200/96 mmHg at that time. INR noted to be 7.9 when checked by visiting nurse (5.96 here) and warfarin has subsequently been held. INR monitoring has been performed through his PCP's office (Dr. Loney Hering). He is also being treated for right stump cellulitis and has reportedly been treated for recurrent pneumonias since September. He denies antecedent dizziness, chest pain, shortness of breath, and palpitations prior to this fall. As per his daughter, he has been having recurrent falls of unclear etiology.  HR reportedly dropped transiently to 27 bpm overnight, and sotalol was held. Hospitalist was called who reviewed telemetry which reportedly demonstrated "2:1 AV block". The patient was aysmptomatic. Sotalol was subsequently held. Noted to be mildly hyperkalemic (5.9) and was given Kayexelate (5.8 this morning). ECG on 07/06/14 demonstrates sinus rhythm with marked 1st degree AV block (PR 544 ms) and old left bundle branch block. Markedly prolonged PR interval has been recognized at visit with Dr. Diona Browner on 07/08/12, at which time he was tolerating sotalol and was asymptomatic.  I have reviewed the telemetry strips. At 0318 on 07/07/14, HR 28 bpm with third degree AV block. There were no pauses.   Allergies  Allergen  Reactions  . Cephalexin   . Ciprofloxacin     Current Facility-Administered Medications  Medication Dose Route Frequency Provider Last Rate Last Dose  . 0.9 %  sodium chloride infusion   Intravenous Continuous Ozella Rocks, MD 100 mL/hr at 07/06/14 2308    . acetaminophen (TYLENOL) tablet 650 mg  650 mg Oral Q6H PRN Ozella Rocks, MD       Or  . acetaminophen (TYLENOL) suppository 650 mg  650 mg Rectal Q6H PRN Ozella Rocks, MD      . aspirin chewable tablet 81 mg  81 mg Oral Daily Ozella Rocks, MD   81 mg at 07/06/14 2130  . enalapril (VASOTEC) tablet 20 mg  20 mg Oral Daily Ozella Rocks, MD      . famotidine (PEPCID) tablet 20 mg  20 mg Oral Daily Ozella Rocks, MD   20 mg at 07/07/14 0108  . gabapentin (NEURONTIN) capsule 600 mg  600 mg Oral BID Ozella Rocks, MD       And  . gabapentin (NEURONTIN) capsule 1,200 mg  1,200 mg Oral QHS Ozella Rocks, MD   1,200 mg at 07/07/14 0108  . insulin aspart (novoLOG) injection 0-15 Units  0-15 Units Subcutaneous TID WC Ozella Rocks, MD   0 Units at 07/07/14 0800  . insulin glargine (LANTUS) injection 23 Units  23 Units Subcutaneous Daily Ozella Rocks, MD      . isosorbide mononitrate (IMDUR) 24 hr tablet 60 mg  60 mg Oral Daily Ozella Rocks, MD      . morphine 2 MG/ML injection 2 mg  2 mg  Intravenous Q4H PRN Ozella Rocks, MD      . ondansetron [facility]) tablet 4 mg  4 mg Oral Q6H PRN Ozella Rocks, MD       Or  . ondansetron [facility]) injection 4 mg  4 mg Intravenous Q6H PRN Ozella Rocks, MD      . simvastatin (ZOCOR) tablet 40 mg  40 mg Oral QHS Ozella Rocks, MD   40 mg at 07/07/14 0108  . sodium chloride 0.9 % injection 3 mL  3 mL Intravenous Q12H Ozella Rocks, MD   3 mL at 07/06/14 2200  . sotalol (BETAPACE) tablet 40 mg  40 mg Oral BID Ozella Rocks, MD   40 mg at 07/06/14 2200  . tiZANidine (ZANAFLEX) tablet 2 mg  2 mg Oral BID Ozella Rocks, MD   2 mg at 07/07/14 0107  . [START ON 07/08/2014]  Warfarin - Pharmacist Dosing Inpatient   Does not apply Q24H Elson Clan, [facility]        Past Medical History  Diagnosis Date  . Atrial fibrillation   . Coronary atherosclerosis of native coronary artery     Multivessel  . TIA (transient ischemic attack)   . Anemia   . GI bleed   . Diabetes type 2, controlled   . Hyperlipidemia   . Peripheral arterial disease   . Essential hypertension, benign   . Partial deafness   . MI (myocardial infarction)     Past Surgical History  Procedure Laterality Date  . Coronary artery bypass graft  1994  . Transurethral resection of prostate    . Below knee leg amputation      Bilateral    History   Social History  . Marital Status: Married    Spouse Name: N/A    Number of Children: N/A  . Years of Education: N/A   Occupational History  . Not on file.   Social History Main Topics  . Smoking status: Former Smoker    Types: Cigarettes  . Smokeless tobacco: Never Used     Comment: Quit 40 years ago  . Alcohol Use: No  . Drug Use: No  . Sexual Activity: Not on file   Other Topics Concern  . Not on file   Social History Narrative     No family history of premature CAD in 1st degree relatives.  Prior to Admission medications   Medication Sig Start Date End Date Taking? Authorizing Provider  aspirin 81 MG tablet Take 81 mg by mouth daily.      Historical Provider, MD  Cholecalciferol (VITAMIN D-3) 1000 UNITS CAPS Take 1 capsule by mouth daily.    Historical Provider, MD  docusate sodium (COLACE) 100 MG capsule Take 100 mg by mouth at bedtime.     Historical Provider, MD  enalapril (VASOTEC) 20 MG tablet Take 20 mg by mouth daily.      Historical Provider, MD  gabapentin (NEURONTIN) 600 MG tablet Take 600 mg by mouth 2 (two) times daily. And 2 at bedtime    Historical Provider, MD  insulin glargine (LANTUS) 100 UNIT/ML injection Inject 35-45 Units into the skin as directed.     Historical Provider, MD  insulin  NPH-insulin regular (HUMULIN 70/30) (70-30) 100 UNIT/ML injection Inject into the skin. 25 UNITS EVERY MORNING & 20 UNITS EVERY EVENING    Historical Provider, MD  insulin regular (NOVOLIN R,HUMULIN R) 100 units/mL injection Inject 7 Units into the skin as directed. IF BS  OVER 200 AT LUNCH TIME    Historical Provider, MD  isosorbide mononitrate (IMDUR) 60 MG 24 hr tablet Take 60 mg by mouth daily.      Historical Provider, MD  NITROGLYCERIN ER PO Take 1 tablet by mouth every 5 (five) minutes x 3 doses as needed.     Historical Provider, MD  ranitidine (ZANTAC) 150 MG tablet Take 150 mg by mouth 2 (two) times daily.    Historical Provider, MD  simvastatin (ZOCOR) 40 MG tablet Take 40 mg by mouth at bedtime.      Historical Provider, MD  sotalol (BETAPACE) 80 MG tablet Take 20 mg by mouth 2 (two) times daily.     Historical Provider, MD  tiZANidine (ZANAFLEX) 4 MG capsule Take 2 mg by mouth 2 (two) times daily.     Historical Provider, MD  vitamin B-12 (CYANOCOBALAMIN) 1000 MCG tablet Take 1,000 mcg by mouth daily.      Historical Provider, MD  vitamin C (ASCORBIC ACID) 500 MG tablet Take 500 mg by mouth daily.      Historical Provider, MD  warfarin (COUMADIN) 5 MG tablet Take 5 mg by mouth as directed. Managed by Dr. Loney HeringBluth    Historical Provider, MD     Review of systems complete and found to be negative unless listed above in HPI     Physical exam Blood pressure 155/74, pulse 64, temperature 97.7 F (36.5 C), temperature source Axillary, resp. rate 20, height 6' (1.829 m), weight 183 lb 6.8 oz (83.2 kg), SpO2 100 %. General: NAD, lethargic but responds to questions appropriately Neck: No JVD, no thyromegaly or thyroid nodule.  Lungs: Poor air movement with diminished sounds b/l. CV: Nondisplaced PMI. Regular rate and rhythm, normal S1/S2, no S3/S4, no murmur.  Bilateral BKA. Abdomen: Soft, nontender, no hepatosplenomegaly, no distention.  Skin: Lesions on right stump, non-ulcerating.    Neurologic: Alert.  Psych: Normal affect. Extremities: Bilateral BKA. HEENT: Normal.   ECG: Most recent ECG reviewed.  Labs:   Lab Results  Component Value Date   WBC 4.2 07/07/2014   HGB 12.6* 07/07/2014   HCT 40.6 07/07/2014   MCV 101.2* 07/07/2014   PLT 139* 07/07/2014    Recent Labs Lab 07/07/14 0610  NA 142  K 5.8*  CL 110  CO2 23  BUN 32*  CREATININE 1.77*  CALCIUM 8.6  PROT 6.0  BILITOT 0.5  ALKPHOS 72  ALT 14  AST 16  GLUCOSE 113*   No results found for: CKTOTAL, CKMB, CKMBINDEX, TROPONINI No results found for: CHOL No results found for: HDL No results found for: LDLCALC No results found for: TRIG No results found for: CHOLHDL No results found for: LDLDIRECT       Studies: No results found.  ASSESSMENT AND PLAN:  1. 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 I have since discontinued it. Given his CKD, it will take at least a few days to clear from his system. He is currently asymptomatic. The etiology of his most recent fall appears mechanical, but I and family members are uncertain as to the etiology of prior recurrent falls. Given that the bradycardia and third degree AV block occurred while on sotalol, he will need cardiac monitoring at the time of discharge (30-day event monitor) to see if he has recurrent bradycardia with AV block associated with symptoms and/or falls. If this is  the case, and given that he had been living independently, I would recommend pacemaker implantation. Because of recurrent falls, I would recommend discontinuing warfarin as he would be at high risk for intracranial trauma/bleed. Family members are in agreement with this plan. 2. Hypoxemia, elevated BNP, diminished breath sounds, and pleural effusions noted on CT (acute on chronic systolic congestive heart failure): I will obtain a chest xray as he  has a known ischemic cardiomyopathy with mildly reduced LV systolic function, EF 45-50%, by echocardiogram in 03/2010, and may be in acute on chronic systolic heart failure. I will also repeat an echocardiogram to see if there has been a decline in LV function given his presentation. 3. CAD with history of CABG: No symptoms of chest pain. Will reassess LV function by echocardiogram. Discontinue sotalol. Continue ASA, enalapril, Imdur, and simvastatin. 4. Atrial fibrillation: This will need to be monitored for recurrence, as I am discontinuing sotalol and warfarin (due to 3rd degree AV block and recurrent falls, respectively). 5. Hyperlipidemia: Continue simvastatin.  Dispo: Management was discussed with nurse, hospitalist, and family members, and all are in agreement.   Signed: Prentice DockerSuresh Namiah Dunnavant, M.D., F.A.C.C.  07/07/2014, 9:30 AM

## 2014-07-07 NOTE — Progress Notes (Signed)
UR chart review completed.  

## 2014-07-07 NOTE — Progress Notes (Signed)
ANTICOAGULATION CONSULT NOTE  Pharmacy Consult for Afib Indication: atrial fibrillation  Allergies  Allergen Reactions  . Cephalexin   . Ciprofloxacin     Patient Measurements: Height: 6' (182.9 cm) Weight: 183 lb 6.8 oz (83.2 kg) IBW/kg (Calculated) : 77.6  Vital Signs: Temp: 97.7 F (36.5 C) (12/17 0542) Temp Source: Axillary (12/17 0542) BP: 155/74 mmHg (12/17 0542) Pulse Rate: 64 (12/17 0542)  Labs:  Recent Labs  07/06/14 2138 07/07/14 0610  HGB  --  12.6*  HCT  --  40.6  PLT  --  139*  LABPROT 53.6* 45.1*  INR 5.96* 4.77*  CREATININE 1.87* 1.77*    Estimated Creatinine Clearance: 31.1 mL/min (by C-G formula based on Cr of 1.77).   Medical History: Past Medical History  Diagnosis Date  . Atrial fibrillation   . Coronary atherosclerosis of native coronary artery     Multivessel  . TIA (transient ischemic attack)   . Anemia   . GI bleed   . Diabetes type 2, controlled   . Hyperlipidemia   . Peripheral arterial disease   . Essential hypertension, benign   . Partial deafness   . MI (myocardial infarction)     Medications:  Prescriptions prior to admission  Medication Sig Dispense Refill Last Dose  . aspirin 81 MG tablet Take 81 mg by mouth daily.     Taking  . Cholecalciferol (VITAMIN D-3) 1000 UNITS CAPS Take 1 capsule by mouth daily.   Taking  . docusate sodium (COLACE) 100 MG capsule Take 100 mg by mouth at bedtime.    Taking  . enalapril (VASOTEC) 20 MG tablet Take 20 mg by mouth daily.     Taking  . gabapentin (NEURONTIN) 600 MG tablet Take 600 mg by mouth 2 (two) times daily. And 2 at bedtime   Taking  . insulin glargine (LANTUS) 100 UNIT/ML injection Inject 35-45 Units into the skin as directed.    Taking  . insulin NPH-insulin regular (HUMULIN 70/30) (70-30) 100 UNIT/ML injection Inject into the skin. 25 UNITS EVERY MORNING & 20 UNITS EVERY EVENING   Taking  . insulin regular (NOVOLIN R,HUMULIN R) 100 units/mL injection Inject 7 Units into  the skin as directed. IF BS OVER 200 AT LUNCH TIME   Taking  . isosorbide mononitrate (IMDUR) 60 MG 24 hr tablet Take 60 mg by mouth daily.     Taking  . NITROGLYCERIN ER PO Take 1 tablet by mouth every 5 (five) minutes x 3 doses as needed.    Taking  . ranitidine (ZANTAC) 150 MG tablet Take 150 mg by mouth 2 (two) times daily.   Taking  . simvastatin (ZOCOR) 40 MG tablet Take 40 mg by mouth at bedtime.     Taking  . sotalol (BETAPACE) 80 MG tablet Take 20 mg by mouth 2 (two) times daily.    Taking  . tiZANidine (ZANAFLEX) 4 MG capsule Take 2 mg by mouth 2 (two) times daily.    Taking  . vitamin B-12 (CYANOCOBALAMIN) 1000 MCG tablet Take 1,000 mcg by mouth daily.     Taking  . vitamin C (ASCORBIC ACID) 500 MG tablet Take 500 mg by mouth daily.     Taking  . warfarin (COUMADIN) 5 MG tablet Take 5 mg by mouth as directed. Managed by Dr. Loney HeringBluth   Taking    Assessment: 89 yoM on chronic warfarin for Afib.  Home dose listed above.  INR supra-therapeutic on admission, but trending down with held doses.  He  fell out of wheelchair at home PTA and hit head which was bleeding, but resolved.   Goal of Therapy:  INR 2-3   Plan:  Hold Coumadin for INR>2 Daily INR  Elson ClanLilliston, [place] Michelle 07/07/2014,8:44 AM

## 2014-07-07 NOTE — Progress Notes (Signed)
  Echocardiogram 2D Echocardiogram has been performed.  Annalie Wenner 07/07/2014, 4:04 PM

## 2014-07-07 NOTE — Evaluation (Signed)
Physical Therapy Evaluation Patient Details Name: Tyler DolphinHoward R Moon MRN: [id number] DOB: 04/05/1923 Today's Date: 07/07/2014   History of Present Illness  Pt is an 80 year old male who reports on 07/05/14 pt stated he was feelin poorly. CBG 378 last night and pt complaining of HA and w/ BP to 190/89. Associated w/ agitation. Fell from wheelchair and hit head on bed post. EMS evaluated pt and placed in bed to rest. Home nurse today noted that his INR was 7.3 and O2 was low. EMS was called out again and came to the [facility]. Currently undergoing treatment for cellulitis for R stump cellulitis. Pt has been on ABX since September for this infection. Family now concerned that L stump is infected. Also recovering from pneumonia from earlier in the fall. Decreased activity level over the past few months. Decreased appetite during this time.   Clinical Impression  Pt is an 80 year old male who presents to PT after sustaining a fall at home.  Secondary to recent treatment for celllitis of Rt stump, and possible infection in Lt stump pt is NWB and unable to put on prosthetics at this time.  Educated pt/family on need to complete slide board transfer for mobility [facility] and [facility].  Unfortunately W/C in hospital not able to be used for slide board transfer, and family reports personal w/c is not present in hospital.  Bed mobility assessed with HOB elevated, and pt able to transfer with min guard and maintain balance at EOB with 1 UE assist against moderate pertebations.  Recommend continued PT while in the hospital to work on bed mobility skills and strengthening of trunk/LE as able to assist with preparations for slideboard and transition to SNF for continued transfer training.  No DME recommendations at this time, will defer to SNF (family reports they have a slide board they think).      Follow Up Recommendations SNF    Equipment Recommendations  None recommended  by PT    Recommendations for Other Services OT consult     Precautions / Restrictions Precautions Precautions: Fall Precaution Comments: (B) BKA Restrictions Weight Bearing Restrictions: Yes RLE Weight Bearing: Non weight bearing LLE Weight Bearing: Non weight bearing Other Position/Activity Restrictions: NWB secondary to wound from prosthetics      Mobility  Bed Mobility Overal bed mobility: Needs Assistance Bed Mobility: Supine to Sit;Sit to Supine     Supine to sit: HOB elevated;Min guard Sit to supine: HOB elevated;Min guard   General bed mobility comments: Pt unable to bridge/scoot up in bed, required assist x2   Transfers                 General transfer comment: Explained to pt/family techniques for slide board transfer, though due to set up of W/C in hospital unable to attempt at this time.      Balance Overall balance assessment: Needs assistance Sitting-balance support: Feet unsupported;Single extremity supported Sitting balance-Leahy Scale: Good Sitting balance - Comments: Able to maintain balance at EOB against moderate pertebations                                     Pertinent Vitals/Pain Pain Assessment: Faces Faces Pain Scale: Hurts little more Pain Location: Rt side of midback Pain Intervention(s): Limited activity within patient's tolerance;Repositioned    Home Living Family/patient expects to be discharged to:: Skilled  nursing facility                 Additional Comments: Pt lives in a single story home with a ramped entryway with his wife.  Family very supportive and able to assist PRN.  DME : (B) BKA prosthetics, BSC x2, std cane x2, shower bench, handicap height toilet, manual W/C    Prior Function Level of Independence: Independent with assistive device(s)         Comments: Per family, pt was able to transfer to/from W/C with (B) prosthetics on and was able to self propel manual W/C.          Extremity/Trunk Assessment   Upper Extremity Assessment: Defer to OT evaluation           Lower Extremity Assessment: Generalized weakness;LLE deficits/detail;RLE deficits/detail RLE Deficits / Details: BKA LLE Deficits / Details: BKA     Communication   Communication: HOH  Cognition Arousal/Alertness: Awake/alert Behavior During Therapy: WFL for tasks assessed/performed Overall Cognitive Status: Within Functional Limits for tasks assessed                       Assessment/Plan    PT Assessment Patient needs continued PT services  PT Diagnosis Generalized weakness   PT Problem List Decreased strength;Decreased activity tolerance;Decreased mobility;Decreased balance  PT Treatment Interventions Balance training;Functional mobility training;Therapeutic activities;Therapeutic exercise;Patient/family education;Neuromuscular re-education   PT Goals (Current goals can be found in the Care Plan section) Acute Rehab PT Goals Patient Stated Goal: be safe to go home PT Goal Formulation: With family Time For Goal Achievement: 07/21/14 Potential to Achieve Goals: Fair    Frequency Min 2X/week   Barriers to discharge Decreased caregiver support         End of Session   Activity Tolerance: Patient tolerated treatment well Patient left: in bed;with call bell/phone within reach;with family/visitor present Nurse Communication: Need for lift equipment;Mobility status         Time: 1355-1415 PT Time Calculation (min) (ACUTE ONLY): 20 min   Charges:   PT Evaluation $Initial PT Evaluation Tier I: 1 Procedure     Kellie ShropshireStephanie Dakwon Wenberg, DPT [phone]

## 2014-07-07 NOTE — Progress Notes (Signed)
Telemetry called regarding patients changing blocks on heart monitor and that patient is now an accelerated junctional rhythm.Dr Ardyth HarpsHernandez notified. Will get EKG, but nothing else to do at this time per physician

## 2014-07-07 NOTE — Progress Notes (Signed)
Called by nurse that patient had second-degree type II block on telemetry. Went and examined the patient, patient awake, communicating, denies any chest pain or shortness of breath. He is asymptomatic at this time. Reviewed the telemetry strip on the computer which showed 2:1 block. Called and discussed with cardiology fellow Dr. Adolm JosephWhitlock, who says to continue observation on telemetry. As usually 2:1 block can be hard to distinguish from wankebach. As patient is totally asymptomatic, will continue to monitor on telemetry. Also has potassium of 5.9 so we'll give Kayexalate 15 g by mouth 1. If patient again displays sinus bradycardia with second-degree type II block, then he will have to be transferred to [place] for further management.

## 2014-07-08 ENCOUNTER — Telehealth: Payer: Self-pay

## 2014-07-08 ENCOUNTER — Encounter (HOSPITAL_COMMUNITY): Payer: Self-pay | Admitting: Cardiology

## 2014-07-08 DIAGNOSIS — E785 Hyperlipidemia, unspecified: Secondary | ICD-10-CM

## 2014-07-08 DIAGNOSIS — I480 Paroxysmal atrial fibrillation: Secondary | ICD-10-CM

## 2014-07-08 DIAGNOSIS — S0990XS Unspecified injury of head, sequela: Secondary | ICD-10-CM

## 2014-07-08 DIAGNOSIS — I10 Essential (primary) hypertension: Secondary | ICD-10-CM

## 2014-07-08 DIAGNOSIS — I442 Atrioventricular block, complete: Secondary | ICD-10-CM

## 2014-07-08 DIAGNOSIS — I25812 Atherosclerosis of bypass graft of coronary artery of transplanted heart without angina pectoris: Secondary | ICD-10-CM

## 2014-07-08 DIAGNOSIS — I5023 Acute on chronic systolic (congestive) heart failure: Principal | ICD-10-CM

## 2014-07-08 DIAGNOSIS — I440 Atrioventricular block, first degree: Secondary | ICD-10-CM | POA: Diagnosis present

## 2014-07-08 DIAGNOSIS — W19XXXD Unspecified fall, subsequent encounter: Secondary | ICD-10-CM

## 2014-07-08 DIAGNOSIS — I447 Left bundle-branch block, unspecified: Secondary | ICD-10-CM

## 2014-07-08 LAB — GLUCOSE, CAPILLARY
GLUCOSE-CAPILLARY: 144 mg/dL — AB (ref 70–99)
GLUCOSE-CAPILLARY: 239 mg/dL — AB (ref 70–99)
Glucose-Capillary: 297 mg/dL — ABNORMAL HIGH (ref 70–99)
Glucose-Capillary: 151 mg/dL — ABNORMAL HIGH (ref 70–99)

## 2014-07-08 MED ORDER — HYDRALAZINE HCL 25 MG PO TABS
25.0000 mg | ORAL_TABLET | Freq: Two times a day (BID) | ORAL | Status: DC
  Administered 2014-07-08 – 2014-07-09 (×3): 25 mg via ORAL
  Filled 2014-07-08 (×3): qty 1

## 2014-07-08 MED ORDER — FUROSEMIDE 10 MG/ML IJ SOLN
40.0000 mg | Freq: Once | INTRAMUSCULAR | Status: AC
  Administered 2014-07-08: 40 mg via INTRAVENOUS
  Filled 2014-07-08: qty 4

## 2014-07-08 NOTE — Progress Notes (Signed)
Pt's O2 saturation 86% on room air at rest. 

## 2014-07-08 NOTE — Clinical Social Work Note (Signed)
CSW met with pt, pt's wife, and son Darryl at bedside. They state they have decided to take pt home with home health. Darryl reports he lives very close to his parents and that the children are going to pitch in to make it work at home. CSW will sign off, but can be reconsulted if needed.  Benay Pike, [place]

## 2014-07-08 NOTE — Telephone Encounter (Signed)
Will place event monitor in Unit of [facility] tonight

## 2014-07-08 NOTE — Telephone Encounter (Signed)
30 day event monitor placed on patient in room 326 of [facility] for transient third degree HB,for probable discharge tomorrow,saturday

## 2014-07-08 NOTE — Progress Notes (Signed)
[facility] PROGRESS NOTE  Tyler DolphinHoward R Moon ZOX:[id number] DOB: 08/19/1922 DOA: 07/06/2014 PCP: Ernestine ConradBLUTH, KIRK, MD  Assessment/Plan: Conduction System Disease -Tele shows prolonged 1st degree AV Block with intermittent 3rd degree AV Block. -Has been seen by cards with recommendations to DC sotalol and arrange for 30 day event monitor upon DC. -If he has recurrent bradycardia with 3rd degree block associated with falls would benefit from a pacemaker. -Cards will arrange for monitor a follow up.  Fall/Deconditioning/Amputee -PT recs SNF but family prefers to take him home. -HH services have been arranged.  Acute on Chronic Systolic CHF -EF known to be 40%, -Per cards, DC on lasix 20 mg daily.  A FIb -Sotalol and coumadin have been discontinued by cards due to heart block and propensity to falls.  Code Status: Full Code Family Communication: Multiple family members at bedside  Disposition Plan: Home in am 12/19   Consultants:  Cardiology   Antibiotics:  None   Subjective: Fatigued  Objective: Filed Vitals:   07/07/14 1412 07/07/14 2133 07/08/14 0556 07/08/14 0926  BP: 150/65 152/83 171/78   Pulse: 67 63 66   Temp: 98.1 F (36.7 C) 97.9 F (36.6 C) 98.2 F (36.8 C)   TempSrc: Oral Oral Oral   Resp: 20 18 18    Height:      Weight:      SpO2: 96% 98% 93% 95%    Intake/Output Summary (Last 24 hours) at 07/08/14 1333 Last data filed at 07/08/14 1237  Gross per 24 hour  Intake 2523.33 ml  Output   1900 ml  Net 623.33 ml   Filed Weights   07/06/14 1900  Weight: 83.2 kg (183 lb 6.8 oz)    Exam:   General:  AA Ox3  Cardiovascular: RRR  Respiratory: decreased bilateral BS  Abdomen: S/NT/ND/+BS  Extremities: bilateral BKA   Neurologic:  Non-focal, generalized weakness  Data Reviewed: Basic Metabolic Panel:  Recent Labs Lab 07/06/14 2138 07/07/14 0610  NA 139 142  K 5.9* 5.8*  CL 107 110  CO2 24 23  GLUCOSE 155* 113*  BUN 37* 32*   CREATININE 1.87* 1.77*  CALCIUM 8.7 8.6   Liver Function Tests:  Recent Labs Lab 07/06/14 2138 07/07/14 0610  AST 16 16  ALT 16 14  ALKPHOS 69 72  BILITOT 0.4 0.5  PROT 5.9* 6.0  ALBUMIN 2.8* 2.9*   No results for input(s): LIPASE, AMYLASE in the last 168 hours. No results for input(s): AMMONIA in the last 168 hours. CBC:  Recent Labs Lab 07/07/14 0610  WBC 4.2  HGB 12.6*  HCT 40.6  MCV 101.2*  PLT 139*   Cardiac Enzymes: No results for input(s): CKTOTAL, CKMB, CKMBINDEX, TROPONINI in the last 168 hours. BNP (last 3 results)  Recent Labs  07/06/14 2138  PROBNP 2693.0*   CBG:  Recent Labs Lab 07/07/14 1122 07/07/14 1656 07/07/14 2319 07/08/14 0747 07/08/14 1148  GLUCAP 193* 253* 204* 144* 239*    No results found for this or any previous visit (from the past 240 hour(s)).   Studies: Dg Chest 1 View  07/07/2014   CLINICAL DATA:  Chest congestion and weakness.  Pleural effusion.  EXAM: CHEST - 1 VIEW  COMPARISON:  None.  FINDINGS: The heart is enlarged. A diffuse interstitial pattern likely represents edema superimposed on chronic disease. Bilateral pleural effusions are evident, left greater than right. Bibasilar airspace disease is noted as well. The patient is status post median sternotomy for CABG. Atherosclerotic changes are  noted at the aortic arch.  IMPRESSION: 1. Cardiomegaly with mild edema and bilateral effusions suggesting congestive heart failure. 2. Left greater than right pleural effusion. 3. Left greater than right basilar airspace disease. While this likely represents atelectasis, left basilar pneumonia is not excluded.   Electronically Signed   By: Gennette Pachris  Mattern M.D.   On: 07/07/2014 20:57    Scheduled Meds: . aspirin  81 mg Oral Daily  . enalapril  20 mg Oral Daily  . famotidine  20 mg Oral Daily  . gabapentin  600 mg Oral BID   And  . gabapentin  1,200 mg Oral QHS  . hydrALAZINE  25 mg Oral BID  . insulin aspart  0-15 Units  Subcutaneous TID WC  . insulin glargine  23 Units Subcutaneous Daily  . isosorbide mononitrate  60 mg Oral Daily  . simvastatin  40 mg Oral QHS  . sodium chloride  3 mL Intravenous Q12H  . tiZANidine  2 mg Oral BID   Continuous Infusions:   Principal Problem:   Fall Active Problems:   CORONARY ATHEROSCLEROSIS NATIVE CORONARY ARTERY   Atrial fibrillation   Diabetes type 2, uncontrolled   S/P bilateral BKA (below knee amputation)   Multiple wounds of skin   AKI (acute kidney injury)   Elevated INR   Head trauma   Pleural effusion   Physical deconditioning    Time spent: 35 minutes. Greater than 50% of this time was spent in direct contact with the patient coordinating care.    Chaya JanHERNANDEZ ACOSTA,Tyler Moon  [facility] Pager [phone]  If 7PM-7AM, please contact night-coverage at [email or web address], password [email or web address] 07/08/2014, 1:33 PM  LOS: 2 days

## 2014-07-08 NOTE — Progress Notes (Addendum)
SUBJECTIVE: No complaints. Says breathing is fine. Denies dizziness. HR currently in 80 bpm range. ECG done yesterday evening showed sinus rhythm with 1st degree AV block (PR 520 ms) with LBBB. CXR showed mild pulmonary edema with b/l pleural effusions and basilar atelectasis, although pneumonia could not be excluded.     Intake/Output Summary (Last 24 hours) at 07/08/14 1249 Last data filed at 07/08/14 1237  Gross per 24 hour  Intake 2523.33 ml  Output   1900 ml  Net 623.33 ml    Current Facility-Administered Medications  Medication Dose Route Frequency Provider Last Rate Last Dose  . acetaminophen (TYLENOL) tablet 650 mg  650 mg Oral Q6H PRN Ozella Rocks, MD       Or  . acetaminophen (TYLENOL) suppository 650 mg  650 mg Rectal Q6H PRN Ozella Rocks, MD      . aspirin chewable tablet 81 mg  81 mg Oral Daily Ozella Rocks, MD   81 mg at 07/08/14 1000  . enalapril (VASOTEC) tablet 20 mg  20 mg Oral Daily Ozella Rocks, MD   20 mg at 07/08/14 0959  . famotidine (PEPCID) tablet 20 mg  20 mg Oral Daily Ozella Rocks, MD   20 mg at 07/08/14 1000  . gabapentin (NEURONTIN) capsule 600 mg  600 mg Oral BID Ozella Rocks, MD   600 mg at 07/08/14 1000   And  . gabapentin (NEURONTIN) capsule 1,200 mg  1,200 mg Oral QHS Ozella Rocks, MD   1,200 mg at 07/07/14 2131  . insulin aspart (novoLOG) injection 0-15 Units  0-15 Units Subcutaneous TID WC Ozella Rocks, MD   2 Units at 07/08/14 1000  . insulin glargine (LANTUS) injection 23 Units  23 Units Subcutaneous Daily Ozella Rocks, MD   23 Units at 07/08/14 [phone]  . isosorbide mononitrate (IMDUR) 24 hr tablet 60 mg  60 mg Oral Daily Ozella Rocks, MD   60 mg at 07/08/14 1000  . morphine 2 MG/ML injection 2 mg  2 mg Intravenous Q4H PRN Ozella Rocks, MD      . ondansetron [facility]) tablet 4 mg  4 mg Oral Q6H PRN Ozella Rocks, MD       Or  . ondansetron [facility]) injection 4 mg  4 mg Intravenous Q6H PRN Ozella Rocks,  MD      . simvastatin (ZOCOR) tablet 40 mg  40 mg Oral QHS Ozella Rocks, MD   40 mg at 07/07/14 2131  . sodium chloride 0.9 % injection 3 mL  3 mL Intravenous Q12H Ozella Rocks, MD   3 mL at 07/08/14 1000  . tiZANidine (ZANAFLEX) tablet 2 mg  2 mg Oral BID Ozella Rocks, MD   2 mg at 07/08/14 0959    Filed Vitals:   07/07/14 1412 07/07/14 2133 07/08/14 0556 07/08/14 0926  BP: 150/65 152/83 171/78   Pulse: 67 63 66   Temp: 98.1 F (36.7 C) 97.9 F (36.6 C) 98.2 F (36.8 C)   TempSrc: Oral Oral Oral   Resp: 20 18 18    Height:      Weight:      SpO2: 96% 98% 93% 95%    PHYSICAL EXAM General: NAD, responds to questions appropriately Neck: No JVD, no thyromegaly or thyroid nodule.  Lungs: Diminished sounds at bases b/l. CV: Nondisplaced PMI. Regular rate and rhythm, normal S1/S2, no S3/S4, no murmur. Bilateral BKA. Abdomen: Soft,  nontender, no hepatosplenomegaly, no distention.  Skin: Lesions on right stump, non-ulcerating.  Neurologic: Alert.  Psych: Normal affect. Extremities: Bilateral BKA. HEENT: Normal.   TELEMETRY: Reviewed telemetry pt in sinus rhythm, 1st degree AV block, LBBB.  LABS: Basic Metabolic Panel:  Recent Labs  16/04/9611/16/15 2138 07/07/14 0610  NA 139 142  K 5.9* 5.8*  CL 107 110  CO2 24 23  GLUCOSE 155* 113*  BUN 37* 32*  CREATININE 1.87* 1.77*  CALCIUM 8.7 8.6   Liver Function Tests:  Recent Labs  07/06/14 2138 07/07/14 0610  AST 16 16  ALT 16 14  ALKPHOS 69 72  BILITOT 0.4 0.5  PROT 5.9* 6.0  ALBUMIN 2.8* 2.9*   No results for input(s): LIPASE, AMYLASE in the last 72 hours. CBC:  Recent Labs  07/07/14 0610  WBC 4.2  HGB 12.6*  HCT 40.6  MCV 101.2*  PLT 139*   Cardiac Enzymes: No results for input(s): CKTOTAL, CKMB, CKMBINDEX, TROPONINI in the last 72 hours. BNP: Invalid input(s): POCBNP D-Dimer: No results for input(s): DDIMER in the last 72 hours. Hemoglobin A1C:  Recent Labs  07/06/14 2138  HGBA1C 8.3*    Fasting Lipid Panel: No results for input(s): CHOL, HDL, LDLCALC, TRIG, CHOLHDL, LDLDIRECT in the last 72 hours. Thyroid Function Tests: No results for input(s): TSH, T4TOTAL, T3FREE, THYROIDAB in the last 72 hours.  Invalid input(s): FREET3 Anemia Panel: No results for input(s): VITAMINB12, FOLATE, FERRITIN, TIBC, IRON, RETICCTPCT in the last 72 hours.  RADIOLOGY: Dg Chest 1 View  07/07/2014   CLINICAL DATA:  Chest congestion and weakness.  Pleural effusion.  EXAM: CHEST - 1 VIEW  COMPARISON:  None.  FINDINGS: The heart is enlarged. A diffuse interstitial pattern likely represents edema superimposed on chronic disease. Bilateral pleural effusions are evident, left greater than right. Bibasilar airspace disease is noted as well. The patient is status post median sternotomy for CABG. Atherosclerotic changes are noted at the aortic arch.  IMPRESSION: 1. Cardiomegaly with mild edema and bilateral effusions suggesting congestive heart failure. 2. Left greater than right pleural effusion. 3. Left greater than right basilar airspace disease. While this likely represents atelectasis, left basilar pneumonia is not excluded.   Electronically Signed   By: Gennette Pachris  Mattern M.D.   On: 07/07/2014 20:57      ASSESSMENT AND PLAN: 1. Conduction system disease with marked 1st degree AV block, chronic left bundle branch block, and transient third degree AV block: He has remained stable off of sotalol thus far. He received last dose of sotalol two nights ago and I have since discontinued it. Given his CKD, it will take at least a few days to clear from his system. He is currently asymptomatic.  The etiology of his most recent fall appears mechanical, but I and family members are uncertain as to the etiology of prior recurrent falls. Given that the bradycardia and third degree AV block occurred while on sotalol, he will need cardiac monitoring at the time of discharge (30-day event monitor) to see if he has  recurrent bradycardia with AV block associated with symptoms and/or falls. If this is the case, and given that he had been living independently, I would recommend pacemaker implantation. Because of recurrent falls, I discontinued warfarin as he would be at high risk for intracranial trauma/bleed. Family members were in agreement with this plan when discussed with them on 12/17.  2. Acute on chronic systolic congestive heart failure: Chest xray results noted above. I have d/c IV fluids  and will give one dose of IV Lasix 40 mg. He has a known ischemic cardiomyopathy with moderately reduced LV systolic function, EF 40%, by echocardiogram performed yesterday. I would discharge him on Lasix 20 mg daily with a BMET next week.  3. CAD with history of CABG: No symptoms of chest pain. LV function is moderately reduced and stable. I discontinued sotalol. Continue ASA, enalapril, Imdur, and simvastatin.  4. Atrial fibrillation: This will need to be monitored for recurrence, as I have discontinued sotalol and warfarin (due to 3rd degree AV block and recurrent falls, respectively).  5. Hyperlipidemia: Continue simvastatin.  6. Essential HTN: Elevated, and would contribute to systolic heart failure. Given his CKD, I will avoid increasing enalapril and add hydralazine at a reduced frequency of 25 mg bid.  Dispo: If he remains stable on 07/09/14, I think he would be stable for discharge. He and family have opted for home health. He will need a 30-day event monitor and close follow up with Dr. Diona BrownerMcDowell, and I will inform the office regarding this and the patient will be contacted.   Prentice DockerSuresh Koneswaran, M.D., F.A.C.C.

## 2014-07-08 NOTE — Progress Notes (Signed)
Inpatient Diabetes Program Recommendations  AACE/ADA: New Consensus Statement on Inpatient Glycemic Control (2013)  Target Ranges:  Prepandial:   less than 140 mg/dL      Peak postprandial:   less than 180 mg/dL (1-2 hours)      Critically ill patients:  140 - 180 mg/dL   Reason for Assessment: Hyperglycemia  Diabetes history:  Type 2 Outpatient Diabetes medications: 70/30 25 units q am, 20 units q pm, Regular insulin 7 units at lunch if CBG > 200 mg/dl Current orders for Inpatient glycemic control: Lantus 23 units daily, Novolog moderate correction tid with meals.  Results for Tyler Moon, Tyler Moon (MRN [id number]) as of 07/08/2014 12:38  Ref. Range 07/07/2014 11:22 07/07/2014 16:56 07/07/2014 23:19 07/08/2014 07:47 07/08/2014 11:48  Glucose-Capillary Latest Range: 70-99 mg/dL 409193 (H) 811253 (H) 914204 (H) 144 (H) 239 (H)   Note:  Patient's CBG's indicate need for Novolog meal coverage to help improve glycemic control.  Request MD consider ordering Novolog 4 units tid with meals to be given if patient eats at least 50% and CBG is at least 80 mg/dl.  Thank you.  Arif Amendola S. Elsie Lincolnouth, RN, CNS, CDE Inpatient Diabetes Program, team pager [phone]

## 2014-07-08 NOTE — Telephone Encounter (Signed)
-----   Message from Laqueta LindenSuresh A Koneswaran, MD sent at 07/08/2014  1:05 PM EST ----- Regarding: Pt needs f/u appt soon with Dr. Diona BrownerMcDowell, and 30-day event monitor. Patient will likely d/c on 12/19. He needs close f/u with Dr. Diona BrownerMcDowell. Also needs 30-day event monitor (Dx: 3rd degree AV block). Thanks.  Dr. Purvis SheffieldKoneswaran

## 2014-07-09 ENCOUNTER — Encounter (HOSPITAL_COMMUNITY): Payer: Self-pay | Admitting: Internal Medicine

## 2014-07-09 DIAGNOSIS — I5023 Acute on chronic systolic (congestive) heart failure: Secondary | ICD-10-CM | POA: Diagnosis present

## 2014-07-09 DIAGNOSIS — J9601 Acute respiratory failure with hypoxia: Secondary | ICD-10-CM

## 2014-07-09 LAB — BASIC METABOLIC PANEL
ANION GAP: 9 (ref 5–15)
BUN: 24 mg/dL — ABNORMAL HIGH (ref 6–23)
CHLORIDE: 106 meq/L (ref 96–112)
CO2: 24 mEq/L (ref 19–32)
Calcium: 9 mg/dL (ref 8.4–10.5)
Creatinine, Ser: 1.41 mg/dL — ABNORMAL HIGH (ref 0.50–1.35)
GFR calc Af Amer: 49 mL/min — ABNORMAL LOW (ref >90)
GFR calc non Af Amer: 43 mL/min — ABNORMAL LOW (ref >90)
GLUCOSE: 131 mg/dL — AB (ref 70–99)
POTASSIUM: 4.9 meq/L (ref 3.7–5.3)
SODIUM: 139 meq/L (ref 137–147)

## 2014-07-09 LAB — CBC
HEMATOCRIT: 41.9 % (ref 39.0–52.0)
Hemoglobin: 13.3 g/dL (ref 13.0–17.0)
MCH: 31.7 pg (ref 26.0–34.0)
MCHC: 31.7 g/dL (ref 30.0–36.0)
MCV: 100 fL (ref 78.0–100.0)
Platelets: 132 10*3/uL — ABNORMAL LOW (ref 150–400)
RBC: 4.19 MIL/uL — AB (ref 4.22–5.81)
RDW: 13.2 % (ref 11.5–15.5)
WBC: 5.6 10*3/uL (ref 4.0–10.5)

## 2014-07-09 LAB — GLUCOSE, CAPILLARY
Glucose-Capillary: 129 mg/dL — ABNORMAL HIGH (ref 70–99)
Glucose-Capillary: 241 mg/dL — ABNORMAL HIGH (ref 70–99)

## 2014-07-09 MED ORDER — SODIUM POLYSTYRENE SULFONATE 15 GM/60ML PO SUSP: ORAL | Status: DC

## 2014-07-09 MED ORDER — INSULIN GLARGINE 100 UNIT/ML ~~LOC~~ SOLN: 17.0000 [IU] | Freq: Two times a day (BID) | SUBCUTANEOUS | Status: AC

## 2014-07-09 MED ORDER — INSULIN ASPART 100 UNIT/ML ~~LOC~~ SOLN: SUBCUTANEOUS | Status: AC

## 2014-07-09 MED ORDER — HYDRALAZINE HCL 25 MG PO TABS: 25.0000 mg | ORAL_TABLET | Freq: Three times a day (TID) | ORAL | Status: AC

## 2014-07-09 MED ORDER — FUROSEMIDE 20 MG PO TABS: 20.0000 mg | ORAL_TABLET | Freq: Every day | ORAL | Status: DC

## 2014-07-09 NOTE — Progress Notes (Deleted)
Patient up to chair this morning. Tolerated well. Encouraged to walk at this time. Patient stated he was going to rest a little while then would walk.

## 2014-07-09 NOTE — Progress Notes (Signed)
Bilateral stumps cleaned and cream applied as ordered.

## 2014-07-09 NOTE — Progress Notes (Signed)
Discharge instructions reviewed with patient, wife, daughter and son. Understanding verbalized. [facility] aware of discharge. Patient hooked to portable oxygen. #0 day holter monitor in place.

## 2014-07-09 NOTE — Discharge Summary (Signed)
Physician Discharge Summary  Karleen DolphinHoward R Burley ZOX:[id number] DOB: 12/30/1922 DOA: 07/06/2014  PCP: Ernestine ConradBLUTH, KIRK, MD  Admit date: 07/06/2014 Discharge date: 07/09/2014  Time spent: Greater than 30 minutes  Recommendations for Outpatient Follow-up:  1. The patient will need his Holter/event monitor results evaluated by cardiology at the follow-up appointment. 2. Recommend follow-up assessment of his basic metabolic panel-particularly his creatinine and serum potassium. 3. Recommend follow-up oxygen saturation on room air and with supplemental oxygen. 4. Recommend follow-up of the patient's blood pressure and modifications in his antihypertensive medication regimen if needed as he was hypertensive at the time of discharge. 5. Patient was discharged with home oxygen-2 L/m and home health services.   Discharge Diagnoses:    1. 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 2. Acute on chronic congestive heart failure. 2-D echocardiogram revealed ejection fraction of 40%. 3. Coronary artery disease with history of CABG. 4. Chronic atrial fibrillation. Coumadin discontinued secondary to high fall risk. 5. Essential hypertension, more elevated off of sotalol. 6. Acute respiratory failure with hypoxia, secondary to acute congestive heart failure. 7. Hyperkalemia. 8. Acute renal injury. Query underlying chronic kidney disease. 9. Diabetes mellitus, uncontrolled with peripheral vascular disease. Hemoglobin A1c 8.3. 10. Status post bilateral BKA. 11. Supratherapeutic INR/coagulopathy secondary to Coumadin. 12. Multiple skin wounds. 13. Chronic physical deconditioning. 14. Thrombocytopenia of unknown etiology.   Discharge Condition: Improved, but chronically debilitated.  Diet recommendation: Carbohydrate modified/heart healthy.  Filed Weights   07/06/14 1900  Weight: 83.2 kg (183 lb 6.8 oz)    History  of present illness:  The patient is an 80 year old man with a history of chronic systolic heart failure, chronic atrial fibrillation, CAD, status post bilateral BKA, and diabetes mellitus, who was presented to the emergency department on 07/06/14 after apparently falling out of his wheelchair, hitting his head. Apparently, there was some bleeding of the scalp, but no loss of consciousness. The patient [facility]. He was transferred to [facility] hospital for further evaluation at the request of the family. Laboratory data on admission were significant for a serum potassium of 5.9, glucose of 192, BUN of 37, creatinine of 1.87, proBNP of 2693, INR of 5.96, hemoglobin of 12.6, and platelet count of 139. His chest x-ray revealed cardiomegaly with mild edema and bilateral pleural effusions, suggesting congestive heart failure. He was admitted for further evaluation and management.  Hospital Course:   1. Conduction system disease. During the first 24 hours of the hospitalization, the patient was noted to have first-degree AV block or varying second-degree AV block. He was also noted to have bradycardia with a heart rate decreasing to the 20s. Eventually cardiology was consulted. Per Dr. Sharene SkeansKoneswaren's assessment, the patient had conduction system disease with market first-degree AV block, chronic left bundle branch block, and transient third-degree AV block. Sotalol was discontinued. He recommended a 30 day event monitor, which was placed during the hospitalization. Future pacemaker implantation is being considered, depending on the results of the event monitor, particularly if the patient is symptomatic with bradycardia. The heart rate improved overall and was ranging from 63-74 on the day of discharge.  Chronic atrial fibrillation with supratherapeutic INR. The patient's INR was supratherapeutic on admission, greater than 5. Coumadin was discontinued indefinitely because of his  falls. His rate was controlled or bradycardic as noted above.   Acute on chronic systolic congestive heart failure. On admission, the patient's chest x-ray was indicative  of heart failure. IV fluids which were started on admission, were discontinued. Cardiology ordered 1 dose of IV Lasix and then restarted him on oral Lasix 20 mg daily. (The patient had been on Lasix in the past). His 2-D echocardiogram during this hospitalization revealed an ejection fraction of 40% and global left ventricular hypokinesis. He was continued on enalapril.  Acute respiratory failure with hypoxia.  The patient was noted to be hypoxic with his oxygen saturations in the mid 80s on room air. He was started on supplemental oxygen at 2 L/m. He was oxygenating 100% prior to discharge.  CAD. He was continued on aspirin, enalapril, Imdur, and simvastatin. He had no complaints of chest pain.  Essential, accelerated hypertension. The patient blood pressure increased off of sotalol. In his absence, hydralazine was started and then titrated up to 25 mg 3 times a day. He was restarted on enalapril. Would consider starting amlodipine in the outpatient setting his blood pressure is still uncontrolled.  Acute renal injury, secondary to prerenal azotemia. The patient's baseline creatinine was unknown, but it would not be surprising if he has underlying chronic kidney disease.He was started on gentle IV fluids and ACE inhibitor was temporarily held. His creatinine improved to 1.41 at the time of discharge.  Hyperkalemia. This was likely secondary to acute renal injury superimposed on ACE inhibitor therapy. As above, he was given gentle IV fluids. He was also given Kayexalate for treatment. Lasix was started which helped with treating the hyperkalemia. His serum potassium was 4.9 at the time of discharge. Although enalapril was restarted, Kayexalate was prescribed at the time of discharge to be taken twice weekly. This could be  increased to daily if needed. Recommend follow-up monitoring of his renal function and serum potassium.  Type 2 diabetes mellitus with peripheral vascular disease/diabetic neuropathy. The patient's home regimen consisted of 70/30 insulin, Lantus, and when necessary Humulin R. To simplify his regimen, he was discharged to home on Lantus twice daily and sliding scale NovoLog 3 times a day. His hemoglobin A1c was 8.3. He was given the name and number of endocrinologist Dr. Fransico Him. The family will likely call to make a new patient appointment for long-term management.  Mechanical fall at home. Home health services were ordered. Coumadin was discontinued indefinitely. Event monitor was placed to evaluate for symptomatic bradycardia.    Procedures: 2-D echocardiogram-Study Conclusions - Left ventricle: The cavity size was normal. There was mild concentric hypertrophy. Systolic function was moderately reduced. The estimated ejection fraction was 40%. Global hypokinesis is noted. Doppler parameters are consistent with high ventricular filling pressure. - Ventricular septum: Septal motion showed abnormal function and dyssynergy. These changes are consistent with a left bundle branch block. - Aortic valve: Mildly to moderately calcified annulus. Mildly thickened leaflets. There was trivial regurgitation. - Mitral valve: Mildly calcified annulus. There was mild to moderate regurgitation. - Left atrium: The atrium was mildly to moderately dilated. - Right ventricle: Systolic function was mildly reduced. - Tricuspid valve: There was mild regurgitation. - Pulmonary arteries: PA peak pressure: 33 mm Hg (S). Mildly elevated pulmonary pressures.  Consultations:  Cardiology  Discharge Exam: Filed Vitals:   07/09/14 0846  BP: 173/79  Pulse:   Temp:   Resp:    pulse 65. Respiratory rate 18. Blood pressure 173/79. Oxygen saturation 100% on 2 L of nasal cannula  oxygen.  General: Debilitated, elderly appearing 80 year old Caucasian man sitting up in bed, in no acute distress. He is hard of hearing. Cardiovascular: S1, S2, with  occasional ectopy, query irregular, irregular. Respiratory: Breathing nonlabored at rest. Lungs clear anteriorly with decreased breath sounds in the bases. Extremities: Bilateral BKA. Healing ulcerations on the medial aspect of the right lower extremity.  Discharge Instructions You were cared for by a hospitalist during your hospital stay. If you have any questions about your discharge medications or the care you received while you were in the hospital after you are discharged, you can call the unit and asked to speak with the hospitalist on call if the hospitalist that took care of you is not available. Once you are discharged, your primary care physician will handle any further medical issues. Please note that NO REFILLS for any discharge medications will be authorized once you are discharged, as it is imperative that you return to your primary care physician (or establish a relationship with a primary care physician if you do not have one) for your aftercare needs so that they can reassess your need for medications and monitor your lab values.  Discharge Instructions    Diet - low sodium heart healthy    Complete by:  As directed      Increase activity slowly    Complete by:  As directed           Current Discharge Medication List    START taking these medications   Details  furosemide (LASIX) 20 MG tablet Take 1 tablet (20 mg total) by mouth daily.    hydrALAZINE (APRESOLINE) 25 MG tablet Take 1 tablet (25 mg total) by mouth 3 (three) times daily. New medication for high blood pressure treatment. Qty: 90 tablet, Refills: 3    insulin aspart (NOVOLOG) 100 UNIT/ML injection Sliding-scale NovoLog 3 times daily: For blood sugar 70 to 130-no insulin. Blood sugar 131-150-give 3 units insulin. Blood sugar 151-200-give 4 units.  Blood sugar 201-250-give 6 units. Blood sugar 231-300-give 9 units. Blood sugar 301-350-give 12 units. Blood sugar 351-400-give 15 units. Blood sugar greater than 400 give 20 units and call your Dr. Rob Bunting: 10 mL, Refills: 11    sodium polystyrene (KAYEXALATE) 15 GM/60ML suspension Take 15 gram solution once every Monday and once every Friday. Medicine to prevent your potassium level from going too high. Qty: 500 mL, Refills: 1      CONTINUE these medications which have CHANGED   Details  insulin glargine (LANTUS) 100 UNIT/ML injection Inject 0.17 mLs (17 Units total) into the skin 2 (two) times daily. Qty: 10 mL, Refills: 11      CONTINUE these medications which have NOT CHANGED   Details  aspirin 81 MG tablet Take 81 mg by mouth daily.      Cholecalciferol (VITAMIN D-3) 1000 UNITS CAPS Take 1 capsule by mouth daily.    enalapril (VASOTEC) 20 MG tablet Take 20 mg by mouth daily.      gabapentin (NEURONTIN) 600 MG tablet Take 600 mg by mouth 2 (two) times daily. And 2 at bedtime    isosorbide mononitrate (IMDUR) 60 MG 24 hr tablet Take 60 mg by mouth daily.      nitroGLYCERIN (NITROSTAT) 0.4 MG SL tablet Place 0.4 mg under the tongue every 5 (five) minutes as needed for chest pain.    ranitidine (ZANTAC) 150 MG tablet Take 150 mg by mouth 2 (two) times daily.    tiZANidine (ZANAFLEX) 4 MG capsule Take 2 mg by mouth 2 (two) times daily.     vitamin B-12 (CYANOCOBALAMIN) 1000 MCG tablet Take 1,000 mcg by mouth daily.  vitamin C (ASCORBIC ACID) 500 MG tablet Take 500 mg by mouth daily.      docusate sodium (COLACE) 100 MG capsule Take 100 mg by mouth at bedtime.     simvastatin (ZOCOR) 40 MG tablet Take 40 mg by mouth at bedtime.        STOP taking these medications     sotalol (BETAPACE) 80 MG tablet      warfarin (COUMADIN) 5 MG tablet      insulin NPH-insulin regular (HUMULIN 70/30) (70-30) 100 UNIT/ML injection      insulin regular (NOVOLIN R,HUMULIN R) 100 units/mL  injection        Allergies  Allergen Reactions  . Cephalexin   . Ciprofloxacin    Follow-up Information    Follow up with [facility].   Contact information:   [address] [place] [address] [phone]       Follow up with Laqueta LindenKONESWARAN, SURESH A, MD.   Specialty:  Cardiology   Why:  Call the cardiologist office in a few days if you don't here from them.   Contact information:   [address] [place] [phone]       Follow up with NIDA,GEBRESELASSIE, MD.   Specialty:  Endocrinology   Why:  Diabetes doctor. Call for an appointment.   Contact information:   [address] [place] [address] [phone]        The results of significant diagnostics from this hospitalization (including imaging, microbiology, ancillary and laboratory) are listed below for reference.    Significant Diagnostic Studies: Dg Chest 1 View  07/07/2014   CLINICAL DATA:  Chest congestion and weakness.  Pleural effusion.  EXAM: CHEST - 1 VIEW  COMPARISON:  None.  FINDINGS: The heart is enlarged. A diffuse interstitial pattern likely represents edema superimposed on chronic disease. Bilateral pleural effusions are evident, left greater than right. Bibasilar airspace disease is noted as well. The patient is status post median sternotomy for CABG. Atherosclerotic changes are noted at the aortic arch.  IMPRESSION: 1. Cardiomegaly with mild edema and bilateral effusions suggesting congestive heart failure. 2. Left greater than right pleural effusion. 3. Left greater than right basilar airspace disease. While this likely represents atelectasis, left basilar pneumonia is not excluded.   Electronically Signed   By: Gennette Pachris  Mattern M.D.   On: 07/07/2014 20:57    Microbiology: No results found for this or any previous visit (from the past 240 hour(s)).   Labs: Basic Metabolic Panel:  Recent Labs Lab 07/06/14 2138 07/07/14 0610 07/09/14 0846  NA 139 142  139  K 5.9* 5.8* 4.9  CL 107 110 106  CO2 24 23 24   GLUCOSE 155* 113* 131*  BUN 37* 32* 24*  CREATININE 1.87* 1.77* 1.41*  CALCIUM 8.7 8.6 9.0   Liver Function Tests:  Recent Labs Lab 07/06/14 2138 07/07/14 0610  AST 16 16  ALT 16 14  ALKPHOS 69 72  BILITOT 0.4 0.5  PROT 5.9* 6.0  ALBUMIN 2.8* 2.9*   No results for input(s): LIPASE, AMYLASE in the last 168 hours. No results for input(s): AMMONIA in the last 168 hours. CBC:  Recent Labs Lab 07/07/14 0610 07/09/14 0846  WBC 4.2 5.6  HGB 12.6* 13.3  HCT 40.6 41.9  MCV 101.2* 100.0  PLT 139* 132*   Cardiac Enzymes: No results for input(s): CKTOTAL, CKMB, CKMBINDEX, TROPONINI in the last 168 hours. BNP: BNP (last 3 results)  Recent Labs  07/06/14 2138  PROBNP 2693.0*   CBG:  Recent Labs Lab 07/08/14 1148 07/08/14 1641 07/08/14 2139 07/09/14 0740 07/09/14 1124  GLUCAP 239* 297* 151* 129* 241*       Signed:  Latisha Lasch  [facility] 07/09/2014, 2:26 PM

## 2014-07-09 NOTE — Progress Notes (Signed)
Patient is in and out of [place], has a history of this and needs a pacemaker, called on-call MD and made him aware, will follow orders given and continue to monitor this patient.

## 2014-07-09 NOTE — Progress Notes (Signed)
[facility] notified of patient discharge home today. Order to resume home health faxed over at this time.

## 2014-07-11 ENCOUNTER — Encounter: Payer: Self-pay | Admitting: Cardiology

## 2014-07-11 ENCOUNTER — Encounter: Payer: Medicare Other | Admitting: Cardiology

## 2014-07-11 DIAGNOSIS — I429 Cardiomyopathy, unspecified: Secondary | ICD-10-CM | POA: Insufficient documentation

## 2014-07-11 NOTE — Progress Notes (Signed)
No show  This encounter was created in error - please disregard.

## 2014-08-17 ENCOUNTER — Ambulatory Visit (INDEPENDENT_AMBULATORY_CARE_PROVIDER_SITE_OTHER): Payer: Medicare Other | Admitting: Cardiology

## 2014-08-17 ENCOUNTER — Ambulatory Visit: Payer: Medicare Other | Admitting: Cardiology

## 2014-08-17 ENCOUNTER — Encounter: Payer: Self-pay | Admitting: Cardiology

## 2014-08-17 ENCOUNTER — Other Ambulatory Visit: Payer: Self-pay | Admitting: *Deleted

## 2014-08-17 VITALS — BP 122/60 | HR 60 | Ht 70.0 in | Wt 189.0 lb

## 2014-08-17 DIAGNOSIS — I429 Cardiomyopathy, unspecified: Secondary | ICD-10-CM

## 2014-08-17 DIAGNOSIS — I480 Paroxysmal atrial fibrillation: Secondary | ICD-10-CM

## 2014-08-17 DIAGNOSIS — I442 Atrioventricular block, complete: Secondary | ICD-10-CM

## 2014-08-17 NOTE — Progress Notes (Signed)
Reason for visit: CAD, atrial fibrillation, hospital follow-up  Clinical Summary Tyler Moon is an 81 y.o.male last seen in June 2015. Interval records reviewed, he was seen by Dr. Purvis Sheffield in December due to transient complete heart block in the setting of left bundle branch block with marked PR prolongation at baseline which had been stable for quite some time on sotalol. He was taken off sotalol, and Coumadin was also discontinued temporarily. Cardiac event monitor was provided after his hospital discharge.  I reviewed the cardiac monitor today. Predominant rhythm was sinus with marked PR prolongation, transient 2:1 heart block noted (second-degree), unable to exclude transient episode of atrial fibrillation although could have been sinus rhythm with blocked PACs.  He is here with his son and daughter. They tell me that he has actually done much better over the last month, still chronically ill but generally has more energy. He has had no unusual dizziness or syncope. I reviewed the concept of conduction system disease, possibility of pacemaker placement if this becomes clearly symptomatic. His heart rates at home have been no lower than the 60s per his daughter, generally higher than that. He has been on Coumadin which is followed by Dr. Loney Hering. Home health nursing has been in place.  Echocardiogram from December 2015 reported LVEF 40%, trivial aortic regurgitation, mild to moderate mitral regurgitation, mild to moderate left atrial enlargement, mild tricuspid regurgitation with PASP 33 mmHg.  Allergies  Allergen Reactions  . Cephalexin   . Ciprofloxacin     Current Outpatient Prescriptions  Medication Sig Dispense Refill  . Cholecalciferol (VITAMIN D-3) 1000 UNITS CAPS Take 1 capsule by mouth daily.    Marland Kitchen docusate sodium (COLACE) 100 MG capsule Take 100 mg by mouth at bedtime.     . enalapril (VASOTEC) 20 MG tablet Take 20 mg by mouth daily.      . furosemide (LASIX) 20 MG tablet Take 1  tablet (20 mg total) by mouth daily.    Marland Kitchen gabapentin (NEURONTIN) 600 MG tablet Take 600 mg by mouth 2 (two) times daily. And 2 at bedtime    . hydrALAZINE (APRESOLINE) 25 MG tablet Take 1 tablet (25 mg total) by mouth 3 (three) times daily. New medication for high blood pressure treatment. 90 tablet 3  . insulin aspart (NOVOLOG) 100 UNIT/ML injection Sliding-scale NovoLog 3 times daily: For blood sugar 70 to 130-no insulin. Blood sugar 131-150-give 3 units insulin. Blood sugar 151-200-give 4 units. Blood sugar 201-250-give 6 units. Blood sugar 231-300-give 9 units. Blood sugar 301-350-give 12 units. Blood sugar 351-400-give 15 units. Blood sugar greater than 400 give 20 units and call your Dr. 10 mL 11  . insulin glargine (LANTUS) 100 UNIT/ML injection Inject 0.17 mLs (17 Units total) into the skin 2 (two) times daily. 10 mL 11  . isosorbide mononitrate (IMDUR) 60 MG 24 hr tablet Take 60 mg by mouth daily.      . nitroGLYCERIN (NITROSTAT) 0.4 MG SL tablet Place 0.4 mg under the tongue every 5 (five) minutes as needed for chest pain.    . ranitidine (ZANTAC) 150 MG tablet Take 150 mg by mouth 2 (two) times daily.    . simvastatin (ZOCOR) 40 MG tablet Take 40 mg by mouth at bedtime.      Marland Kitchen tiZANidine (ZANAFLEX) 4 MG capsule Take 2 mg by mouth 2 (two) times daily.     . vitamin B-12 (CYANOCOBALAMIN) 1000 MCG tablet Take 1,000 mcg by mouth daily.      . vitamin  C (ASCORBIC ACID) 500 MG tablet Take 500 mg by mouth daily.      Marland Kitchen. warfarin (COUMADIN) 3 MG tablet Take 3 mg by mouth daily. As directed     No current facility-administered medications for this visit.    Past Medical History  Diagnosis Date  . Atrial fibrillation   . Coronary atherosclerosis of native coronary artery     Multivessel  . TIA (transient ischemic attack)   . Anemia   . GI bleed   . Diabetes type 2, controlled   . Hyperlipidemia   . Peripheral arterial disease   . Essential hypertension, benign   . Partial deafness   .  MI (myocardial infarction)   . S/P bilateral BKA (below knee amputation)   . Complete heart block     On sotalol  . First degree heart block     Past Surgical History  Procedure Laterality Date  . Coronary artery bypass graft  1994  . Transurethral resection of prostate    . Below knee leg amputation      Bilateral    Family History  Problem Relation Age of Onset  . Heart disease Mother   . Stroke Father     Social History Tyler Moon reports that he has quit smoking. His smoking use included Cigarettes. He has never used smokeless tobacco. Tyler Moon reports that he does not drink alcohol.  Review of Systems Complete review of systems negative except as otherwise outlined in the clinical summary and also the following. Very hard of hearing.  Physical Examination Filed Vitals:   08/17/14 1122  BP: 122/60  Pulse: 60    Wt Readings from Last 3 Encounters:  08/17/14 189 lb (85.73 kg)  01/04/14 199 lb 12.8 oz (90.629 kg)  07/05/13 199 lb 12.8 oz (90.629 kg)   Chronically ill-appearing male, comfortable at rest.  HEENT: Conjunctiva and lids normal, oropharynx with poor dentition.  Neck: Supple, no elevated JVP. No thyromegaly or bruits.  Lungs: Diminished breath sounds, nonlabored.  Cardiac: Regular rate and rhythm, soft systolic murmur, no S3 gallop.  Abdomen: Soft, nontender, bowel sounds present.  Extremities: Status post bilateral below the knee amputations, right side with prosthesis and left side dressed    Problem List and Plan   Complete heart block This was noted transiently while on sotalol, now resolved by follow-up monitoring off the medication. He still has obvious conduction system disease with markedly prolonged PR interval and left bundle branch block. Transient 2:1 heart block noted by subsequent evaluation. He was not obviously symptomatic with this however. After discussion today, plan is to continue observation for now, realizing that pacemaker  would be the next step if he becomes clearly symptomatic or has persisting high degree heart block. Office follow-up arranged.   Atrial fibrillation Paroxysmal, he continues on Coumadin with follow-up per Dr. Loney HeringBluth.   Secondary cardiomyopathy Heart failure symptoms were exacerbated by slow heart rates, patient has been doing much better, weight is down 10 pounds, he is tolerating current dose of Lasix.     Jonelle SidleSamuel G. Haily Moon, M.D., F.A.C.C.

## 2014-08-17 NOTE — Assessment & Plan Note (Signed)
This was noted transiently while on sotalol, now resolved by follow-up monitoring off the medication. He still has obvious conduction system disease with markedly prolonged PR interval and left bundle branch block. Transient 2:1 heart block noted by subsequent evaluation. He was not obviously symptomatic with this however. After discussion today, plan is to continue observation for now, realizing that pacemaker would be the next step if he becomes clearly symptomatic or has persisting high degree heart block. Office follow-up arranged.

## 2014-08-17 NOTE — Assessment & Plan Note (Signed)
Heart failure symptoms were exacerbated by slow heart rates, patient has been doing much better, weight is down 10 pounds, he is tolerating current dose of Lasix.

## 2014-08-17 NOTE — Patient Instructions (Signed)
Your physician recommends that you schedule a follow-up appointment in: 3 months. Your physician recommends that you continue on your current medications as directed. Please refer to the Current Medication list given to you today. 

## 2014-08-17 NOTE — Assessment & Plan Note (Signed)
Paroxysmal, he continues on Coumadin with follow-up per Dr. Loney HeringBluth.

## 2014-08-20 ENCOUNTER — Inpatient Hospital Stay (HOSPITAL_COMMUNITY)
Admission: EM | Admit: 2014-08-20 | Discharge: 2014-08-25 | DRG: 291 | Disposition: A | Discharge status: Skilled Nursing Facility | Payer: Medicare Other | Attending: Internal Medicine | Admitting: Internal Medicine

## 2014-08-20 ENCOUNTER — Emergency Department (HOSPITAL_COMMUNITY): Payer: Medicare Other

## 2014-08-20 ENCOUNTER — Encounter (HOSPITAL_COMMUNITY): Payer: Self-pay | Admitting: *Deleted

## 2014-08-20 DIAGNOSIS — Y92009 Unspecified place in unspecified non-institutional (private) residence as the place of occurrence of the external cause: Secondary | ICD-10-CM

## 2014-08-20 DIAGNOSIS — I248 Other forms of acute ischemic heart disease: Secondary | ICD-10-CM | POA: Diagnosis present

## 2014-08-20 DIAGNOSIS — Z8673 Personal history of transient ischemic attack (TIA), and cerebral infarction without residual deficits: Secondary | ICD-10-CM

## 2014-08-20 DIAGNOSIS — I509 Heart failure, unspecified: Secondary | ICD-10-CM

## 2014-08-20 DIAGNOSIS — I252 Old myocardial infarction: Secondary | ICD-10-CM | POA: Diagnosis not present

## 2014-08-20 DIAGNOSIS — Z89512 Acquired absence of left leg below knee: Secondary | ICD-10-CM | POA: Diagnosis not present

## 2014-08-20 DIAGNOSIS — I2510 Atherosclerotic heart disease of native coronary artery without angina pectoris: Secondary | ICD-10-CM | POA: Diagnosis not present

## 2014-08-20 DIAGNOSIS — J811 Chronic pulmonary edema: Secondary | ICD-10-CM

## 2014-08-20 DIAGNOSIS — I739 Peripheral vascular disease, unspecified: Secondary | ICD-10-CM | POA: Diagnosis not present

## 2014-08-20 DIAGNOSIS — Z89511 Acquired absence of right leg below knee: Secondary | ICD-10-CM | POA: Diagnosis not present

## 2014-08-20 DIAGNOSIS — E785 Hyperlipidemia, unspecified: Secondary | ICD-10-CM | POA: Diagnosis not present

## 2014-08-20 DIAGNOSIS — H9190 Unspecified hearing loss, unspecified ear: Secondary | ICD-10-CM | POA: Diagnosis not present

## 2014-08-20 DIAGNOSIS — I255 Ischemic cardiomyopathy: Secondary | ICD-10-CM | POA: Diagnosis present

## 2014-08-20 DIAGNOSIS — N184 Chronic kidney disease, stage 4 (severe): Secondary | ICD-10-CM | POA: Diagnosis present

## 2014-08-20 DIAGNOSIS — E1165 Type 2 diabetes mellitus with hyperglycemia: Secondary | ICD-10-CM | POA: Diagnosis present

## 2014-08-20 DIAGNOSIS — Z951 Presence of aortocoronary bypass graft: Secondary | ICD-10-CM | POA: Diagnosis not present

## 2014-08-20 DIAGNOSIS — I4891 Unspecified atrial fibrillation: Secondary | ICD-10-CM | POA: Diagnosis present

## 2014-08-20 DIAGNOSIS — W1839XA Other fall on same level, initial encounter: Secondary | ICD-10-CM | POA: Diagnosis not present

## 2014-08-20 DIAGNOSIS — I5023 Acute on chronic systolic (congestive) heart failure: Secondary | ICD-10-CM | POA: Diagnosis not present

## 2014-08-20 DIAGNOSIS — W19XXXA Unspecified fall, initial encounter: Secondary | ICD-10-CM

## 2014-08-20 DIAGNOSIS — I480 Paroxysmal atrial fibrillation: Secondary | ICD-10-CM | POA: Diagnosis present

## 2014-08-20 DIAGNOSIS — D649 Anemia, unspecified: Secondary | ICD-10-CM | POA: Diagnosis not present

## 2014-08-20 DIAGNOSIS — J9620 Acute and chronic respiratory failure, unspecified whether with hypoxia or hypercapnia: Secondary | ICD-10-CM | POA: Diagnosis present

## 2014-08-20 DIAGNOSIS — I440 Atrioventricular block, first degree: Secondary | ICD-10-CM | POA: Diagnosis present

## 2014-08-20 DIAGNOSIS — IMO0002 Reserved for concepts with insufficient information to code with codable children: Secondary | ICD-10-CM | POA: Diagnosis present

## 2014-08-20 DIAGNOSIS — S32592A Other specified fracture of left pubis, initial encounter for closed fracture: Secondary | ICD-10-CM | POA: Diagnosis not present

## 2014-08-20 DIAGNOSIS — S329XXA Fracture of unspecified parts of lumbosacral spine and pelvis, initial encounter for closed fracture: Secondary | ICD-10-CM

## 2014-08-20 DIAGNOSIS — I129 Hypertensive chronic kidney disease with stage 1 through stage 4 chronic kidney disease, or unspecified chronic kidney disease: Secondary | ICD-10-CM | POA: Diagnosis present

## 2014-08-20 DIAGNOSIS — R531 Weakness: Secondary | ICD-10-CM

## 2014-08-20 DIAGNOSIS — M25552 Pain in left hip: Secondary | ICD-10-CM | POA: Diagnosis present

## 2014-08-20 DIAGNOSIS — N289 Disorder of kidney and ureter, unspecified: Secondary | ICD-10-CM

## 2014-08-20 HISTORY — DX: Nonrheumatic mitral (valve) insufficiency: I34.0

## 2014-08-20 HISTORY — DX: Myoneural disorder, unspecified: G70.9

## 2014-08-20 HISTORY — DX: Unspecified osteoarthritis, unspecified site: M19.90

## 2014-08-20 LAB — CBC WITH DIFFERENTIAL/PLATELET
BASOS ABS: 0 10*3/uL (ref 0.0–0.1)
Basophils Relative: 0 % (ref 0–1)
Eosinophils Absolute: 0.1 10*3/uL (ref 0.0–0.7)
Eosinophils Relative: 1 % (ref 0–5)
HCT: 39.6 % (ref 39.0–52.0)
HEMOGLOBIN: 12.4 g/dL — AB (ref 13.0–17.0)
LYMPHS ABS: 0.6 10*3/uL — AB (ref 0.7–4.0)
Lymphocytes Relative: 10 % — ABNORMAL LOW (ref 12–46)
MCH: 31.8 pg (ref 26.0–34.0)
MCHC: 31.3 g/dL (ref 30.0–36.0)
MCV: 101.5 fL — ABNORMAL HIGH (ref 78.0–100.0)
Monocytes Absolute: 0.7 10*3/uL (ref 0.1–1.0)
Monocytes Relative: 11 % (ref 3–12)
Neutro Abs: 5.3 10*3/uL (ref 1.7–7.7)
Neutrophils Relative %: 78 % — ABNORMAL HIGH (ref 43–77)
PLATELETS: 125 10*3/uL — AB (ref 150–400)
RBC: 3.9 MIL/uL — AB (ref 4.22–5.81)
RDW: 13.9 % (ref 11.5–15.5)
WBC: 6.7 10*3/uL (ref 4.0–10.5)

## 2014-08-20 LAB — COMPREHENSIVE METABOLIC PANEL
ALT: 31 U/L (ref 0–53)
ANION GAP: 5 (ref 5–15)
AST: 25 U/L (ref 0–37)
Albumin: 3.1 g/dL — ABNORMAL LOW (ref 3.5–5.2)
Alkaline Phosphatase: 57 U/L (ref 39–117)
BUN: 37 mg/dL — AB (ref 6–23)
CHLORIDE: 109 mmol/L (ref 96–112)
CO2: 29 mmol/L (ref 19–32)
Calcium: 8.5 mg/dL (ref 8.4–10.5)
Creatinine, Ser: 1.56 mg/dL — ABNORMAL HIGH (ref 0.50–1.35)
GFR calc Af Amer: 44 mL/min — ABNORMAL LOW (ref >90)
GFR, EST NON AFRICAN AMERICAN: 38 mL/min — AB (ref >90)
GLUCOSE: 142 mg/dL — AB (ref 70–99)
POTASSIUM: 4.2 mmol/L (ref 3.5–5.1)
Sodium: 143 mmol/L (ref 135–145)
Total Bilirubin: 1 mg/dL (ref 0.3–1.2)
Total Protein: 5.8 g/dL — ABNORMAL LOW (ref 6.0–8.3)

## 2014-08-20 LAB — PROTIME-INR
INR: 1.42 (ref 0.00–1.49)
PROTHROMBIN TIME: 17.5 s — AB (ref 11.6–15.2)

## 2014-08-20 LAB — BRAIN NATRIURETIC PEPTIDE: B Natriuretic Peptide: 705 pg/mL — ABNORMAL HIGH (ref 0.0–100.0)

## 2014-08-20 LAB — TROPONIN I
TROPONIN I: 0.32 ng/mL — AB (ref <0.031)
TROPONIN I: 0.38 ng/mL — AB (ref <0.031)

## 2014-08-20 MED ORDER — FUROSEMIDE 10 MG/ML IJ SOLN
80.0000 mg | Freq: Once | INTRAMUSCULAR | Status: AC
Start: 2014-08-20 — End: 2014-08-20
  Administered 2014-08-20: 80 mg via INTRAVENOUS
  Filled 2014-08-20: qty 8

## 2014-08-20 MED ORDER — WARFARIN - PHARMACIST DOSING INPATIENT
Status: DC
  Administered 2014-08-22 – 2014-08-24 (×3)

## 2014-08-20 MED ORDER — WARFARIN SODIUM 5 MG PO TABS
5.0000 mg | ORAL_TABLET | Freq: Once | ORAL | Status: AC
  Administered 2014-08-20: 5 mg via ORAL
  Filled 2014-08-20: qty 1

## 2014-08-20 MED ORDER — NITROGLYCERIN IN D5W 200-5 MCG/ML-% IV SOLN
5.0000 ug/min | Freq: Once | INTRAVENOUS | Status: AC
  Administered 2014-08-20: 5 ug/min via INTRAVENOUS
  Filled 2014-08-20: qty 250

## 2014-08-20 NOTE — H&P (Addendum)
Tyler Moon is an 81 y.o. male.    Dr. Domenic Moon (cardiologist) Tyler Moon (pcp), [place], [place]  Chief Complaint: fall HPI: 81 yo male with hx of Afib, CAD s/p CABG, Dm2, apparently c/o fall while transferring to chair to walker and lost balance and fell backwards.  Pt therefore came to ED for evaluation.  Pt was found in ED to have uncontrolled htn and CXR showed CHF.   With + trop 0.32.  There is no cardiology service on the weekend at [facility], so ED requested admission to [facility] for pubi rami fracture and CHF (EF 40%) mild on CXR.     Past Medical History  Diagnosis Date  . Atrial fibrillation   . Coronary atherosclerosis of native coronary artery     Multivessel  . TIA (transient ischemic attack)   . Anemia   . GI bleed   . Diabetes type 2, controlled   . Hyperlipidemia   . Peripheral arterial disease   . Essential hypertension, benign   . Partial deafness   . MI (myocardial infarction)   . S/P bilateral BKA (below knee amputation)   . Complete heart block     On sotalol  . First degree heart block     Past Surgical History  Procedure Laterality Date  . Coronary artery bypass graft  1994  . Transurethral resection of prostate    . Below knee leg amputation      Bilateral  . Cataract extraction      Family History  Problem Relation Age of Onset  . Heart disease Mother   . Stroke Father    Social History:  reports that he has quit smoking. His smoking use included Cigarettes. He has never used smokeless tobacco. He reports that he does not drink alcohol or use illicit drugs.  Allergies:  Allergies  Allergen Reactions  . Cephalexin Other (See Comments)    Unknown   . Ciprofloxacin Other (See Comments)    Unknown      (Not in a hospital admission)  Results for orders placed or performed during the hospital encounter of 08/20/14 (from the past 48 hour(s))  Protime-INR     Status: Abnormal   Collection Time: 08/20/14  7:00 PM  Result Value Ref Range    Prothrombin Time 17.5 (H) 11.6 - 15.2 seconds   INR 1.42 0.00 - 1.49  Troponin I     Status: Abnormal   Collection Time: 08/20/14  7:00 PM  Result Value Ref Range   Troponin I 0.32 (H) <0.031 ng/mL    Comment:        PERSISTENTLY INCREASED TROPONIN VALUES IN THE RANGE OF 0.04-0.49 ng/mL CAN BE SEEN IN:       -UNSTABLE ANGINA       -CONGESTIVE HEART FAILURE       -MYOCARDITIS       -CHEST TRAUMA       -ARRYHTHMIAS       -LATE PRESENTING MYOCARDIAL INFARCTION       -COPD   CLINICAL FOLLOW-UP RECOMMENDED.   Comprehensive metabolic panel     Status: Abnormal   Collection Time: 08/20/14  7:00 PM  Result Value Ref Range   Sodium 143 135 - 145 mmol/L   Potassium 4.2 3.5 - 5.1 mmol/L   Chloride 109 96 - 112 mmol/L   CO2 29 19 - 32 mmol/L   Glucose, Bld 142 (H) 70 - 99 mg/dL   BUN 37 (H) 6 - 23 mg/dL  Creatinine, Ser 1.56 (H) 0.50 - 1.35 mg/dL   Calcium 8.5 8.4 - 10.5 mg/dL   Total Protein 5.8 (L) 6.0 - 8.3 g/dL   Albumin 3.1 (L) 3.5 - 5.2 g/dL   AST 25 0 - 37 U/L   ALT 31 0 - 53 U/L   Alkaline Phosphatase 57 39 - 117 U/L   Total Bilirubin 1.0 0.3 - 1.2 mg/dL   GFR calc non Af Amer 38 (L) >90 mL/min   GFR calc Af Amer 44 (L) >90 mL/min    Comment: (NOTE) The eGFR has been calculated using the CKD EPI equation. This calculation has not been validated in all clinical situations. eGFR's persistently <90 mL/min signify possible Chronic Kidney Disease.    Anion gap 5 5 - 15  CBC with Differential/Platelet     Status: Abnormal   Collection Time: 08/20/14  7:00 PM  Result Value Ref Range   WBC 6.7 4.0 - 10.5 K/uL   RBC 3.90 (L) 4.22 - 5.81 MIL/uL   Hemoglobin 12.4 (L) 13.0 - 17.0 g/dL   HCT 39.6 39.0 - 52.0 %   MCV 101.5 (H) 78.0 - 100.0 fL   MCH 31.8 26.0 - 34.0 pg   MCHC 31.3 30.0 - 36.0 g/dL   RDW 13.9 11.5 - 15.5 %   Platelets 125 (L) 150 - 400 K/uL   Neutrophils Relative % 78 (H) 43 - 77 %   Neutro Abs 5.3 1.7 - 7.7 K/uL   Lymphocytes Relative 10 (L) 12 - 46 %    Lymphs Abs 0.6 (L) 0.7 - 4.0 K/uL   Monocytes Relative 11 3 - 12 %   Monocytes Absolute 0.7 0.1 - 1.0 K/uL   Eosinophils Relative 1 0 - 5 %   Eosinophils Absolute 0.1 0.0 - 0.7 K/uL   Basophils Relative 0 0 - 1 %   Basophils Absolute 0.0 0.0 - 0.1 K/uL   Dg Chest 1 View  08/20/2014   CLINICAL DATA:  Status post fall 1 day ago.  Bilateral hip pain.  EXAM: CHEST - 1 VIEW  COMPARISON:  08/18/2014  FINDINGS: Mild bilateral interstitial thickening. Moderate right and trace left pleural effusions. Bilateral central vascular prominence. No pneumothorax. Stable cardiomegaly. Prior CABG. No acute osseous abnormality.  IMPRESSION: Overall findings concerning for mild CHF.   Electronically Signed   By: Kathreen Devoid   On: 08/20/2014 20:03   Ct Head Wo Contrast  08/20/2014   CLINICAL DATA:  Status post fall 08/19/2014 with a blow to the head. The patient is anticoagulated.  EXAM: CT HEAD WITHOUT CONTRAST  TECHNIQUE: Contiguous axial images were obtained from the base of the skull through the vertex without intravenous contrast.  COMPARISON:  Head CT scan 07/06/2014.  FINDINGS: The brain is atrophic with chronic microvascular ischemic change. Large, remote right parietal and temporal lobe infarct is identified. There is no evidence of acute abnormality in infarction, hemorrhage, mass lesion, mass effect, midline shift or abnormal extra-axial fluid collection. No hydrocephalus or pneumocephalus. The calvarium is intact. Imaged paranasal sinuses and mastoid air cells are clear.  IMPRESSION: No acute abnormality.  Atrophy, chronic microvascular ischemic change and remote right temporal and parietal lobe infarct.   Electronically Signed   By: Inge Rise M.D.   On: 08/20/2014 19:42   Dg Hips Bilat With Pelvis 2v  08/20/2014   CLINICAL DATA:  Status post fall.  Left hip pain.  EXAM: DG HIP W/ PELVIS 2V BILAT  COMPARISON:  None.  FINDINGS: There  is severe osteopenia. There is no hip fracture or dislocation. There is  a nondisplaced fracture of the left inferior pubic ramus.  There is peripheral vascular atherosclerotic disease.  IMPRESSION: 1. No hip fracture or dislocation. 2. Nondisplaced fracture of the left inferior pubic ramus.   Electronically Signed   By: Kathreen Devoid   On: 08/20/2014 20:05    Review of Systems  Constitutional: Negative for fever, chills, weight loss, malaise/fatigue and diaphoresis.  HENT: Negative for congestion, ear discharge, ear pain, hearing loss, nosebleeds, sore throat and tinnitus.   Eyes: Negative for blurred vision, double vision, photophobia, pain, discharge and redness.  Respiratory: Positive for cough. Negative for hemoptysis, sputum production, shortness of breath, wheezing and stridor.   Cardiovascular: Negative for chest pain, palpitations, orthopnea, claudication, leg swelling and PND.  Gastrointestinal: Negative for heartburn, nausea, vomiting, abdominal pain, diarrhea, constipation, blood in stool and melena.  Genitourinary: Negative for dysuria, urgency, frequency, hematuria and flank pain.  Musculoskeletal: Negative for myalgias, back pain, joint pain, falls and neck pain.  Skin: Negative for itching and rash.  Neurological: Negative for dizziness, tingling, tremors, sensory change, speech change, focal weakness, seizures, loss of consciousness, weakness and headaches.  Endo/Heme/Allergies: Negative for environmental allergies and polydipsia. Does not bruise/bleed easily.  Psychiatric/Behavioral: Negative for depression, suicidal ideas, hallucinations, memory loss and substance abuse. The patient is not nervous/anxious and does not have insomnia.     Blood pressure 187/129, pulse 86, temperature 98 F (36.7 C), temperature source Oral, resp. rate 36, SpO2 95 %. Physical Exam  Constitutional: He is oriented to person, place, and time. He appears well-developed and well-nourished.  HENT:  Head: Normocephalic and atraumatic.  Eyes: Conjunctivae and EOM are  normal. Pupils are equal, round, and reactive to light. No scleral icterus.  Neck: Normal range of motion. Neck supple. No JVD present. No tracheal deviation present. No thyromegaly present.  Cardiovascular: Exam reveals no gallop and no friction rub.   Murmur heard. Irr, s1, s2  Respiratory: Effort normal. No respiratory distress. He has no wheezes. He has rales. He exhibits no tenderness.  GI: Soft. Bowel sounds are normal. He exhibits no distension. There is no tenderness. There is no rebound and no guarding.  Musculoskeletal: Normal range of motion. He exhibits no edema or tenderness.  Lymphadenopathy:    He has no cervical adenopathy.  Neurological: He is alert and oriented to person, place, and time. He has normal reflexes. He displays normal reflexes. No cranial nerve deficit. He exhibits normal muscle tone. Coordination normal.  Skin: Skin is warm and dry. No rash noted. No erythema. No pallor.  Psychiatric: He has a normal mood and affect. His behavior is normal. Judgment and thought content normal.     Assessment/Plan Fall with Pubic Rami fracture Observation, pain control Please get bone density test as an outpatient r/o osteoporosis  Hypertensive urgency ? Due to pain? Iv nitro Hydralazine 37m iv q6h prn sbp>160 Continue current bp medication  CHF (ef40%), mild acute Likely due to uncontrolled htn Lasix  iv qday Cycle cardiac markers Check tsh Strict i and o Check daily weight ED consulted cardiology, appreciate input NPO after mn  CAD  + trop Possibly due to hypertensive emergency/chf Cycle cardiac markers.  Cont aspirin, statin, b blocker, ace i  Dm2 fsbs q4h, iss Hold lantus  Renal insufficiency Check cmp in am May need to hold enalapril if worsens  Anemia Check cbc in am  Pafib (chads2=6) Cont coumadin pharmacy to dose  DVT  prophylaxis: cont coumadin Jani Gravel 08/20/2014, 9:05 PM

## 2014-08-20 NOTE — ED Notes (Signed)
MD at bedside. 

## 2014-08-20 NOTE — ED Notes (Addendum)
Pt  Comes in by EMS. EMS states pt had a fall yesterday when trying to move from his wheel chair to his walker. Complains of left sided hip pain. Pt states he hit his head and he is on blood thinners. Pt is below the knee amputee bilateral. NAD noted at this time. Pt is alert and oriented x4.

## 2014-08-20 NOTE — ED Provider Notes (Signed)
CSN: [id number]     Arrival date & time 08/20/14  1748 History   First MD Initiated Contact with Patient 08/20/14 1811     Chief Complaint  Patient presents with  . Fall     (Consider location/radiation/quality/duration/timing/severity/associated sxs/prior Treatment) The history is provided by the patient.   81 year old gentleman bilateral amputee however uses prosthesis and does walk with a walker. Still lives at home with his wife. Does have home nursing. Patient with fall yesterday while trying to move from his wheelchair to his walker. Patient with left hip pain since then. Today in the morning patient wasn't feeling well had diaphoresis. Family's been noting increased shortness of breath throughout the day. Patient denies any chest pain at all. Patient's family felt that he probably did hit his head when he fell patient complaining of head pain. Patient is normally on Coumadin for atrial fibrillation. Patient is also followed by [facility] cardiology. Patient with known history of congestive heart failure and atrial fibrillation.  Past Medical History  Diagnosis Date  . Atrial fibrillation   . Coronary atherosclerosis of native coronary artery     Multivessel  . TIA (transient ischemic attack)   . Anemia   . GI bleed   . Diabetes type 2, controlled   . Hyperlipidemia   . Peripheral arterial disease   . Essential hypertension, benign   . Partial deafness   . MI (myocardial infarction)   . S/P bilateral BKA (below knee amputation)   . Complete heart block     On sotalol  . First degree heart block    Past Surgical History  Procedure Laterality Date  . Coronary artery bypass graft  1994  . Transurethral resection of prostate    . Below knee leg amputation      Bilateral   Family History  Problem Relation Age of Onset  . Heart disease Mother   . Stroke Father    History  Substance Use Topics  . Smoking status: Former Smoker    Types: Cigarettes  . Smokeless tobacco: Never  Used     Comment: Quit 40 years ago  . Alcohol Use: No    Review of Systems  Constitutional: Positive for diaphoresis. Negative for fever.  HENT: Positive for congestion.   Eyes: Negative for redness.  Respiratory: Positive for cough and shortness of breath.   Cardiovascular: Negative for chest pain.  Gastrointestinal: Negative for nausea, vomiting and abdominal pain.  Genitourinary: Negative for dysuria.  Musculoskeletal: Negative for back pain and neck pain.  Skin: Negative for rash.  Neurological: Negative for speech difficulty and headaches.  Hematological: Bruises/bleeds easily.  Psychiatric/Behavioral: Negative for confusion.      Allergies  Cephalexin and Ciprofloxacin  Home Medications   Prior to Admission medications   Medication Sig Start Date End Date Taking? Authorizing Provider  aspirin EC 81 MG tablet Take 81 mg by mouth daily.   Yes Historical Provider, MD  cholecalciferol (VITAMIN D) 1000 UNITS tablet Take 1,000 Units by mouth daily.   Yes Historical Provider, MD  docusate sodium (COLACE) 100 MG capsule Take 100 mg by mouth at bedtime.    Yes Historical Provider, MD  enalapril (VASOTEC) 20 MG tablet Take 20 mg by mouth daily.     Yes Historical Provider, MD  furosemide (LASIX) 20 MG tablet Take 1 tablet (20 mg total) by mouth daily. 07/09/14  Yes Elliot Cousin, MD  gabapentin (NEURONTIN) 600 MG tablet Take 600 mg by mouth 2 (two) times daily. And  2 at bedtime   Yes Historical Provider, MD  hydrALAZINE (APRESOLINE) 25 MG tablet Take 1 tablet (25 mg total) by mouth 3 (three) times daily. New medication for high blood pressure treatment. 07/09/14  Yes Elliot Cousin, MD  insulin aspart (NOVOLOG) 100 UNIT/ML injection Sliding-scale NovoLog 3 times daily: For blood sugar 70 to 130-no insulin. Blood sugar 131-150-give 3 units insulin. Blood sugar 151-200-give 4 units. Blood sugar 201-250-give 6 units. Blood sugar 231-300-give 9 units. Blood sugar 301-350-give 12 units.  Blood sugar 351-400-give 15 units. Blood sugar greater than 400 give 20 units and call your Dr. Patient taking differently: Inject 3-20 Units into the skin 3 (three) times daily. Sliding-scale NovoLog 3 times daily: For blood sugar 70 to 130-no insulin. Blood sugar 131-150-give 3 units insulin. Blood sugar 151-200-give 4 units. Blood sugar 201-250-give 6 units. Blood sugar 231-300-give 9 units. Blood sugar 301-350-give 12 units. Blood sugar 351-400-give 15 units. Blood sugar greater than 400 give 20 units and call your Dr. 07/09/14  Yes Elliot Cousin, MD  insulin glargine (LANTUS) 100 UNIT/ML injection Inject 0.17 mLs (17 Units total) into the skin 2 (two) times daily. 07/09/14  Yes Elliot Cousin, MD  isosorbide mononitrate (IMDUR) 60 MG 24 hr tablet Take 60 mg by mouth daily.     Yes Historical Provider, MD  ranitidine (ZANTAC) 150 MG tablet Take 150 mg by mouth 2 (two) times daily.   Yes Historical Provider, MD  simvastatin (ZOCOR) 40 MG tablet Take 40 mg by mouth at bedtime.     Yes Historical Provider, MD  tiZANidine (ZANAFLEX) 4 MG capsule Take 2 mg by mouth 2 (two) times daily.    Yes Historical Provider, MD  vitamin B-12 (CYANOCOBALAMIN) 1000 MCG tablet Take 1,000 mcg by mouth daily.     Yes Historical Provider, MD  vitamin C (ASCORBIC ACID) 500 MG tablet Take 500 mg by mouth daily.     Yes Historical Provider, MD  warfarin (COUMADIN) 5 MG tablet Take by mouth.   Yes Historical Provider, MD  nitroGLYCERIN (NITROSTAT) 0.4 MG SL tablet Place 0.4 mg under the tongue every 5 (five) minutes as needed for chest pain.    Historical Provider, MD   BP 163/107 mmHg  Pulse 93  Temp(Src) 98 F (36.7 C) (Oral)  SpO2 93% Physical Exam  Constitutional: He is oriented to person, place, and time. He appears well-developed and well-nourished. No distress.  HENT:  Head: Normocephalic and atraumatic.  Mouth/Throat: Oropharynx is clear and moist.  Eyes: Conjunctivae and EOM are normal. Pupils are equal,  round, and reactive to light.  Neck: Normal range of motion.  Cardiovascular: Normal rate, regular rhythm and normal heart sounds.   No murmur heard. Pulmonary/Chest: Effort normal. No respiratory distress. He has rales.  Abdominal: Soft. Bowel sounds are normal. There is no tenderness.  Musculoskeletal: Normal range of motion. He exhibits tenderness. He exhibits no edema.  Tenderness to palpation to left hip area. No deformity. Patient bilateral low the knee amputations. Stumps well-healed.  Neurological: He is alert and oriented to person, place, and time. No cranial nerve deficit. He exhibits normal muscle tone. Coordination normal.  Patient is hard of hearing.  Skin: Skin is warm. No rash noted.  Vitals reviewed.   ED Course  Procedures (including critical care time) Labs Review Labs Reviewed  PROTIME-INR - Abnormal; Notable for the following:    Prothrombin Time 17.5 (*)    All other components within normal limits  TROPONIN I - Abnormal; Notable for  the following:    Troponin I 0.32 (*)    All other components within normal limits  COMPREHENSIVE METABOLIC PANEL - Abnormal; Notable for the following:    Glucose, Bld 142 (*)    BUN 37 (*)    Creatinine, Ser 1.56 (*)    Total Protein 5.8 (*)    Albumin 3.1 (*)    GFR calc non Af Amer 38 (*)    GFR calc Af Amer 44 (*)    All other components within normal limits  CBC WITH DIFFERENTIAL/PLATELET - Abnormal; Notable for the following:    RBC 3.90 (*)    Hemoglobin 12.4 (*)    MCV 101.5 (*)    Platelets 125 (*)    Neutrophils Relative % 78 (*)    Lymphocytes Relative 10 (*)    Lymphs Abs 0.6 (*)    All other components within normal limits  URINALYSIS, ROUTINE W REFLEX MICROSCOPIC  BRAIN NATRIURETIC PEPTIDE   Results for orders placed or performed during the hospital encounter of 08/20/14  Protime-INR  Result Value Ref Range   Prothrombin Time 17.5 (H) 11.6 - 15.2 seconds   INR 1.42 0.00 - 1.49  Troponin I  Result  Value Ref Range   Troponin I 0.32 (H) <0.031 ng/mL  Comprehensive metabolic panel  Result Value Ref Range   Sodium 143 135 - 145 mmol/L   Potassium 4.2 3.5 - 5.1 mmol/L   Chloride 109 96 - 112 mmol/L   CO2 29 19 - 32 mmol/L   Glucose, Bld 142 (H) 70 - 99 mg/dL   BUN 37 (H) 6 - 23 mg/dL   Creatinine, Ser 1.611.56 (H) 0.50 - 1.35 mg/dL   Calcium 8.5 8.4 - 09.610.5 mg/dL   Total Protein 5.8 (L) 6.0 - 8.3 g/dL   Albumin 3.1 (L) 3.5 - 5.2 g/dL   AST 25 0 - 37 U/L   ALT 31 0 - 53 U/L   Alkaline Phosphatase 57 39 - 117 U/L   Total Bilirubin 1.0 0.3 - 1.2 mg/dL   GFR calc non Af Amer 38 (L) >90 mL/min   GFR calc Af Amer 44 (L) >90 mL/min   Anion gap 5 5 - 15  CBC with Differential/Platelet  Result Value Ref Range   WBC 6.7 4.0 - 10.5 K/uL   RBC 3.90 (L) 4.22 - 5.81 MIL/uL   Hemoglobin 12.4 (L) 13.0 - 17.0 g/dL   HCT 04.539.6 40.939.0 - 81.152.0 %   MCV 101.5 (H) 78.0 - 100.0 fL   MCH 31.8 26.0 - 34.0 pg   MCHC 31.3 30.0 - 36.0 g/dL   RDW 91.413.9 78.211.5 - 95.615.5 %   Platelets 125 (L) 150 - 400 K/uL   Neutrophils Relative % 78 (H) 43 - 77 %   Neutro Abs 5.3 1.7 - 7.7 K/uL   Lymphocytes Relative 10 (L) 12 - 46 %   Lymphs Abs 0.6 (L) 0.7 - 4.0 K/uL   Monocytes Relative 11 3 - 12 %   Monocytes Absolute 0.7 0.1 - 1.0 K/uL   Eosinophils Relative 1 0 - 5 %   Eosinophils Absolute 0.1 0.0 - 0.7 K/uL   Basophils Relative 0 0 - 1 %   Basophils Absolute 0.0 0.0 - 0.1 K/uL     Imaging Review Dg Chest 1 View  08/20/2014   CLINICAL DATA:  Status post fall 1 day ago.  Bilateral hip pain.  EXAM: CHEST - 1 VIEW  COMPARISON:  08/18/2014  FINDINGS: Mild bilateral interstitial  thickening. Moderate right and trace left pleural effusions. Bilateral central vascular prominence. No pneumothorax. Stable cardiomegaly. Prior CABG. No acute osseous abnormality.  IMPRESSION: Overall findings concerning for mild CHF.   Electronically Signed   By: Elige Ko   On: 08/20/2014 20:03   Ct Head Wo Contrast  08/20/2014   CLINICAL DATA:   Status post fall 08/19/2014 with a blow to the head. The patient is anticoagulated.  EXAM: CT HEAD WITHOUT CONTRAST  TECHNIQUE: Contiguous axial images were obtained from the base of the skull through the vertex without intravenous contrast.  COMPARISON:  Head CT scan 07/06/2014.  FINDINGS: The brain is atrophic with chronic microvascular ischemic change. Large, remote right parietal and temporal lobe infarct is identified. There is no evidence of acute abnormality in infarction, hemorrhage, mass lesion, mass effect, midline shift or abnormal extra-axial fluid collection. No hydrocephalus or pneumocephalus. The calvarium is intact. Imaged paranasal sinuses and mastoid air cells are clear.  IMPRESSION: No acute abnormality.  Atrophy, chronic microvascular ischemic change and remote right temporal and parietal lobe infarct.   Electronically Signed   By: Drusilla Kanner M.D.   On: 08/20/2014 19:42   Dg Hips Bilat With Pelvis 2v  08/20/2014   CLINICAL DATA:  Status post fall.  Left hip pain.  EXAM: DG HIP W/ PELVIS 2V BILAT  COMPARISON:  None.  FINDINGS: There is severe osteopenia. There is no hip fracture or dislocation. There is a nondisplaced fracture of the left inferior pubic ramus.  There is peripheral vascular atherosclerotic disease.  IMPRESSION: 1. No hip fracture or dislocation. 2. Nondisplaced fracture of the left inferior pubic ramus.   Electronically Signed   By: Elige Ko   On: 08/20/2014 20:05     EKG Interpretation   Date/Time:  Saturday August 20 2014 17:56:36 EST Ventricular Rate:  95 PR Interval:    QRS Duration: 164 QT Interval:  397 QTC Calculation: 499 R Axis:   58 Text Interpretation:  Atrial fibrillation IVCD, consider atypical LBBB  Baseline wander in lead(s) V3 Confirmed by Tracy Gerken  MD, Kaysee Hergert ([id number]) on  08/20/2014 6:02:20 PM       CRITICAL CARE Performed by: Vanetta Mulders Total critical care time: 30 Critical care time was exclusive of separately billable  procedures and treating other patients. Critical care was necessary to treat or prevent imminent or life-threatening deterioration. Critical care was time spent personally by me on the following activities: development of treatment plan with patient and/or surrogate as well as nursing, discussions with consultants, evaluation of patient's response to treatment, examination of patient, obtaining history from patient or surrogate, ordering and performing treatments and interventions, ordering and review of laboratory studies, ordering and review of radiographic studies, pulse oximetry and re-evaluation of patient's condition.    MDM   Final diagnoses:  Weakness  Fall  Pelvic fracture, closed, initial encounter  Acute on chronic congestive heart failure, unspecified congestive heart failure type    Patient with fall yesterday. Resulting in left hip pain which seems to be due to the pubic ramus fracture on the pelvis. No evidence of hip fracture. In addition patient's been having increased shortness of breath today and some diaphoresis earlier. Patient with known cardiomyopathy known congestive heart failure. By chest x-ray seems to be developing some CHF. Initial troponin was abnormal. Second troponin pending. Patient blood pressure very high of 187/129. For the congestive heart failure developing as well as increased shortness of breath cough and sats on 2 L of oxygen his normal  amount in the low 90s. Patient given 80 of Lasix normally takes Lasix on a regular basis given lasix 80 of Lasix IV and started on nitroglycerin drip. Patient without any chest pain at all.  Discussed with the hospitalist here and with cardiology at [facility]. Patient will probably be transferred down to [facility] service. Cardiology aware of the patient and can be consult when she arrives. If the troponin is going up patient probably candidate for cath lab. Patient quite functional.  In addition for the fall patient  without any evidence of head injury chest x-ray as stated with the fluid x-rays of the pelvis showing. Ramus fracture. In addition patient is normally on Coumadin for atrial fibrillation. Patient stopped several days ago. And now is subtherapeutic.    Vanetta Mulders, MD 08/20/14 2137

## 2014-08-21 DIAGNOSIS — I509 Heart failure, unspecified: Secondary | ICD-10-CM

## 2014-08-21 LAB — BRAIN NATRIURETIC PEPTIDE: B NATRIURETIC PEPTIDE 5: 715 pg/mL — AB (ref 0.0–100.0)

## 2014-08-21 LAB — URINE MICROSCOPIC-ADD ON

## 2014-08-21 LAB — GLUCOSE, CAPILLARY
GLUCOSE-CAPILLARY: 78 mg/dL (ref 70–99)
Glucose-Capillary: 61 mg/dL — ABNORMAL LOW (ref 70–99)
Glucose-Capillary: 78 mg/dL (ref 70–99)
Glucose-Capillary: 232 mg/dL — ABNORMAL HIGH (ref 70–99)
Glucose-Capillary: 294 mg/dL — ABNORMAL HIGH (ref 70–99)

## 2014-08-21 LAB — URINALYSIS, ROUTINE W REFLEX MICROSCOPIC
Bilirubin Urine: NEGATIVE
GLUCOSE, UA: NEGATIVE mg/dL
KETONES UR: NEGATIVE mg/dL
Leukocytes, UA: NEGATIVE
Nitrite: NEGATIVE
PH: 7 (ref 5.0–8.0)
PROTEIN: 30 mg/dL — AB
Specific Gravity, Urine: 1.01 (ref 1.005–1.030)
Urobilinogen, UA: 0.2 mg/dL (ref 0.0–1.0)

## 2014-08-21 LAB — LIPID PANEL
CHOL/HDL RATIO: 3.3 ratio
Cholesterol: 106 mg/dL (ref 0–200)
HDL: 32 mg/dL — AB (ref >39)
LDL Cholesterol: 55 mg/dL (ref 0–99)
TRIGLYCERIDES: 94 mg/dL (ref <150)
VLDL: 19 mg/dL (ref 0–40)

## 2014-08-21 LAB — TROPONIN I
TROPONIN I: 0.28 ng/mL — AB (ref <0.031)
Troponin I: 0.36 ng/mL — ABNORMAL HIGH (ref <0.031)
Troponin I: 0.24 ng/mL — ABNORMAL HIGH (ref <0.031)

## 2014-08-21 LAB — PROTIME-INR
INR: 1.35 (ref 0.00–1.49)
INR: 1.46 (ref 0.00–1.49)
Prothrombin Time: 16.8 seconds — ABNORMAL HIGH (ref 11.6–15.2)
Prothrombin Time: 17.8 seconds — ABNORMAL HIGH (ref 11.6–15.2)

## 2014-08-21 LAB — TSH: TSH: 3.771 u[IU]/mL (ref 0.350–4.500)

## 2014-08-21 MED ORDER — INSULIN ASPART 100 UNIT/ML ~~LOC~~ SOLN
0.0000 [IU] | Freq: Three times a day (TID) | SUBCUTANEOUS | Status: DC
  Administered 2014-08-21: 3 [IU] via SUBCUTANEOUS

## 2014-08-21 MED ORDER — WARFARIN SODIUM 5 MG PO TABS
5.0000 mg | ORAL_TABLET | Freq: Once | ORAL | Status: AC
  Administered 2014-08-21: 5 mg via ORAL
  Filled 2014-08-21: qty 1

## 2014-08-21 MED ORDER — VITAMIN C 500 MG PO TABS
500.0000 mg | ORAL_TABLET | Freq: Every day | ORAL | Status: DC
  Administered 2014-08-21 – 2014-08-25 (×5): 500 mg via ORAL
  Filled 2014-08-21 (×5): qty 1

## 2014-08-21 MED ORDER — MORPHINE SULFATE 2 MG/ML IJ SOLN
1.0000 mg | INTRAMUSCULAR | Status: DC | PRN
  Administered 2014-08-21 (×3): 1 mg via INTRAVENOUS
  Filled 2014-08-21 (×2): qty 1

## 2014-08-21 MED ORDER — FUROSEMIDE 10 MG/ML IJ SOLN
20.0000 mg | Freq: Every day | INTRAMUSCULAR | Status: DC
  Administered 2014-08-21: 20 mg via INTRAVENOUS
  Filled 2014-08-21: qty 2

## 2014-08-21 MED ORDER — TIZANIDINE HCL 2 MG PO TABS
2.0000 mg | ORAL_TABLET | Freq: Two times a day (BID) | ORAL | Status: DC
  Administered 2014-08-21 – 2014-08-25 (×9): 2 mg via ORAL
  Filled 2014-08-21 (×11): qty 1

## 2014-08-21 MED ORDER — ACETAMINOPHEN 650 MG RE SUPP: 650.0000 mg | Freq: Four times a day (QID) | RECTAL | Status: DC | PRN

## 2014-08-21 MED ORDER — FUROSEMIDE 10 MG/ML IJ SOLN
40.0000 mg | Freq: Two times a day (BID) | INTRAMUSCULAR | Status: DC
  Administered 2014-08-21: 40 mg via INTRAVENOUS
  Filled 2014-08-21: qty 4

## 2014-08-21 MED ORDER — NITROGLYCERIN 2 % TD OINT
1.0000 [in_us] | TOPICAL_OINTMENT | Freq: Three times a day (TID) | TRANSDERMAL | Status: DC
  Administered 2014-08-21 (×2): 1 [in_us] via TOPICAL
  Filled 2014-08-21 (×2): qty 1
  Filled 2014-08-21: qty 30

## 2014-08-21 MED ORDER — SODIUM CHLORIDE 0.9 % IJ SOLN
3.0000 mL | Freq: Two times a day (BID) | INTRAMUSCULAR | Status: DC
  Administered 2014-08-21 – 2014-08-25 (×9): 3 mL via INTRAVENOUS

## 2014-08-21 MED ORDER — VITAMIN B-12 1000 MCG PO TABS
1000.0000 ug | ORAL_TABLET | Freq: Every day | ORAL | Status: DC
  Administered 2014-08-21 – 2014-08-25 (×5): 1000 ug via ORAL
  Filled 2014-08-21 (×5): qty 1

## 2014-08-21 MED ORDER — NITROGLYCERIN 0.4 MG SL SUBL: 0.4000 mg | SUBLINGUAL_TABLET | SUBLINGUAL | Status: DC | PRN

## 2014-08-21 MED ORDER — ASPIRIN EC 81 MG PO TBEC
81.0000 mg | DELAYED_RELEASE_TABLET | Freq: Every day | ORAL | Status: DC
  Administered 2014-08-21 – 2014-08-25 (×5): 81 mg via ORAL
  Filled 2014-08-21 (×5): qty 1

## 2014-08-21 MED ORDER — FAMOTIDINE 20 MG PO TABS
20.0000 mg | ORAL_TABLET | Freq: Every day | ORAL | Status: DC
  Administered 2014-08-21 – 2014-08-25 (×5): 20 mg via ORAL
  Filled 2014-08-21 (×5): qty 1

## 2014-08-21 MED ORDER — HYDRALAZINE HCL 25 MG PO TABS
25.0000 mg | ORAL_TABLET | Freq: Three times a day (TID) | ORAL | Status: DC
  Administered 2014-08-21 (×3): 25 mg via ORAL
  Filled 2014-08-21 (×6): qty 1

## 2014-08-21 MED ORDER — HYDRALAZINE HCL 20 MG/ML IJ SOLN
5.0000 mg | Freq: Once | INTRAMUSCULAR | Status: AC
  Administered 2014-08-21: 5 mg via INTRAVENOUS
  Filled 2014-08-21: qty 1

## 2014-08-21 MED ORDER — SODIUM CHLORIDE 0.9 % IJ SOLN: 3.0000 mL | Freq: Two times a day (BID) | INTRAMUSCULAR | Status: DC

## 2014-08-21 MED ORDER — VITAMIN D3 25 MCG (1000 UNIT) PO TABS
1000.0000 [IU] | ORAL_TABLET | Freq: Every day | ORAL | Status: DC
  Administered 2014-08-21 – 2014-08-25 (×5): 1000 [IU] via ORAL
  Filled 2014-08-21 (×5): qty 1

## 2014-08-21 MED ORDER — ACETAMINOPHEN 325 MG PO TABS
650.0000 mg | ORAL_TABLET | Freq: Four times a day (QID) | ORAL | Status: DC | PRN
  Administered 2014-08-21 – 2014-08-23 (×2): 650 mg via ORAL
  Filled 2014-08-21 (×2): qty 2

## 2014-08-21 MED ORDER — MORPHINE SULFATE 2 MG/ML IJ SOLN
INTRAMUSCULAR | Status: AC
  Administered 2014-08-21: 1 mg via INTRAVENOUS
  Filled 2014-08-21: qty 1

## 2014-08-21 MED ORDER — HYDRALAZINE HCL 20 MG/ML IJ SOLN: 10.0000 mg | Freq: Four times a day (QID) | INTRAMUSCULAR | Status: DC | PRN

## 2014-08-21 MED ORDER — DOCUSATE SODIUM 100 MG PO CAPS
100.0000 mg | ORAL_CAPSULE | Freq: Every day | ORAL | Status: DC
  Administered 2014-08-21 – 2014-08-24 (×4): 100 mg via ORAL
  Filled 2014-08-21 (×5): qty 1

## 2014-08-21 MED ORDER — SODIUM CHLORIDE 0.9 % IJ SOLN: 3.0000 mL | INTRAMUSCULAR | Status: DC | PRN

## 2014-08-21 MED ORDER — GABAPENTIN 600 MG PO TABS
600.0000 mg | ORAL_TABLET | Freq: Two times a day (BID) | ORAL | Status: DC
  Administered 2014-08-21 – 2014-08-25 (×9): 600 mg via ORAL
  Filled 2014-08-21 (×10): qty 1

## 2014-08-21 MED ORDER — ENALAPRIL MALEATE 20 MG PO TABS
20.0000 mg | ORAL_TABLET | Freq: Every day | ORAL | Status: DC
  Administered 2014-08-21: 20 mg via ORAL
  Filled 2014-08-21 (×2): qty 1

## 2014-08-21 MED ORDER — SIMVASTATIN 40 MG PO TABS
40.0000 mg | ORAL_TABLET | Freq: Every day | ORAL | Status: DC
  Administered 2014-08-21 – 2014-08-24 (×4): 40 mg via ORAL
  Filled 2014-08-21 (×5): qty 1

## 2014-08-21 MED ORDER — METOPROLOL TARTRATE 12.5 MG HALF TABLET
12.5000 mg | ORAL_TABLET | Freq: Two times a day (BID) | ORAL | Status: DC
  Administered 2014-08-21 (×2): 12.5 mg via ORAL
  Filled 2014-08-21 (×4): qty 1

## 2014-08-21 MED ORDER — NITROGLYCERIN 0.4 MG/HR TD PT24
MEDICATED_PATCH | TRANSDERMAL | Status: AC
  Filled 2014-08-21: qty 1

## 2014-08-21 MED ORDER — GUAIFENESIN-CODEINE 100-10 MG/5ML PO SOLN
10.0000 mL | Freq: Four times a day (QID) | ORAL | Status: DC | PRN
Start: 2014-08-21 — End: 2014-08-25
  Administered 2014-08-21 – 2014-08-24 (×2): 10 mL via ORAL
  Filled 2014-08-21 (×2): qty 10

## 2014-08-21 MED ORDER — SODIUM CHLORIDE 0.9 % IV SOLN: 250.0000 mL | INTRAVENOUS | Status: DC | PRN

## 2014-08-21 NOTE — Progress Notes (Signed)
Utilization review completed.  

## 2014-08-21 NOTE — Progress Notes (Signed)
ANTICOAGULATION CONSULT NOTE - Initial Consult  Pharmacy Consult for Coumadin Indication: atrial fibrillation  Allergies  Allergen Reactions  . Cephalexin Other (See Comments)    Unknown   . Ciprofloxacin Other (See Comments)    Unknown     Patient Measurements: Height: 6' (182.9 cm) IBW/kg (Calculated) : 77.6  Vital Signs: Temp: 97.3 F (36.3 C) (01/31 1409) Temp Source: Oral (01/31 1409) BP: 131/60 mmHg (01/31 1409) Pulse Rate: 84 (01/31 1409)  Labs:  Recent Labs  08/20/14 1900 08/20/14 2145 08/21/14 0441 08/21/14 0446  HGB 12.4*  --   --   --   HCT 39.6  --   --   --   PLT 125*  --   --   --   LABPROT 17.5*  --  16.8*  --   INR 1.42  --  1.35  --   CREATININE 1.56*  --   --   --   TROPONINI 0.32* 0.38*  --  0.36*    Estimated Creatinine Clearance: 35.2 mL/min (by C-G formula based on Cr of 1.56).   Medical History: Past Medical History  Diagnosis Date  . Atrial fibrillation   . Coronary atherosclerosis of native coronary artery     Multivessel  . TIA (transient ischemic attack)   . Anemia   . GI bleed   . Diabetes type 2, controlled   . Hyperlipidemia   . Peripheral arterial disease   . Essential hypertension, benign   . Partial deafness   . MI (myocardial infarction)   . S/P bilateral BKA (below knee amputation)   . Complete heart block     On sotalol  . First degree heart block   . Mitral regurgitation     mild-moderate    Medications:  Prescriptions prior to admission  Medication Sig Dispense Refill Last Dose  . aspirin EC 81 MG tablet Take 81 mg by mouth daily.   08/20/2014  . cholecalciferol (VITAMIN D) 1000 UNITS tablet Take 1,000 Units by mouth daily.   08/20/2014  . docusate sodium (COLACE) 100 MG capsule Take 100 mg by mouth at bedtime.    08/19/2014  . enalapril (VASOTEC) 20 MG tablet Take 20 mg by mouth daily.     08/20/2014  . furosemide (LASIX) 20 MG tablet Take 1 tablet (20 mg total) by mouth daily.   08/20/2014  . gabapentin  (NEURONTIN) 600 MG tablet Take 600 mg by mouth 2 (two) times daily. And 2 at bedtime   08/20/2014  . hydrALAZINE (APRESOLINE) 25 MG tablet Take 1 tablet (25 mg total) by mouth 3 (three) times daily. New medication for high blood pressure treatment. 90 tablet 3 08/20/2014  . insulin aspart (NOVOLOG) 100 UNIT/ML injection Sliding-scale NovoLog 3 times daily: For blood sugar 70 to 130-no insulin. Blood sugar 131-150-give 3 units insulin. Blood sugar 151-200-give 4 units. Blood sugar 201-250-give 6 units. Blood sugar 231-300-give 9 units. Blood sugar 301-350-give 12 units. Blood sugar 351-400-give 15 units. Blood sugar greater than 400 give 20 units and call your Dr. (Patient taking differently: Inject 3-20 Units into the skin 3 (three) times daily. Sliding-scale NovoLog 3 times daily: For blood sugar 70 to 130-no insulin. Blood sugar 131-150-give 3 units insulin. Blood sugar 151-200-give 4 units. Blood sugar 201-250-give 6 units. Blood sugar 231-300-give 9 units. Blood sugar 301-350-give 12 units. Blood sugar 351-400-give 15 units. Blood sugar greater than 400 give 20 units and call your Dr.) 10 mL 11 08/20/2014  . insulin glargine (LANTUS) 100 UNIT/ML  injection Inject 0.17 mLs (17 Units total) into the skin 2 (two) times daily. 10 mL 11 08/20/2014  . isosorbide mononitrate (IMDUR) 60 MG 24 hr tablet Take 60 mg by mouth daily.     08/20/2014  . ranitidine (ZANTAC) 150 MG tablet Take 150 mg by mouth 2 (two) times daily.   08/20/2014  . simvastatin (ZOCOR) 40 MG tablet Take 40 mg by mouth at bedtime.     08/19/2014  . tiZANidine (ZANAFLEX) 4 MG capsule Take 2 mg by mouth 2 (two) times daily.    08/20/2014  . vitamin B-12 (CYANOCOBALAMIN) 1000 MCG tablet Take 1,000 mcg by mouth daily.     08/20/2014  . vitamin C (ASCORBIC ACID) 500 MG tablet Take 500 mg by mouth daily.     08/20/2014  . warfarin (COUMADIN) 5 MG tablet Take by mouth.     . nitroGLYCERIN (NITROSTAT) 0.4 MG SL tablet Place 0.4 mg under the tongue every 5  (five) minutes as needed for chest pain.   Never    Assessment: 81 yo M presented to [facility] after fall resulting in pubic rami fracture.  Of note, pt was supposed to be on Coumadin PTA for hx of afib.  MD notes document this but family states the patient has not had any doses in the last week, supported by his subtherapeutic INR on admission.  To restart Coumadin.  INR 1.35.  Goal of Therapy:  INR 2-3 Monitor platelets by anticoagulation protocol: Yes   Plan:  Coumadin  PO x 1 tonight. Daily INR.  [employer], Pharm.D., BCPS Clinical Pharmacist Pager [phone] 08/21/2014 3:07 PM

## 2014-08-21 NOTE — Progress Notes (Signed)
  Echocardiogram 2D Echocardiogram has been performed.  Tyler Moon 08/21/2014, 3:02 PM

## 2014-08-21 NOTE — Progress Notes (Addendum)
Patient Demographics  Tyler Moon, is a 81 y.o. male, DOB - 03-22-1923, ZOX:[id number]  Admit date - 08/20/2014   Admitting Physician Pearson Grippe, MD  Outpatient Primary MD for the patient is [name], MD  LOS - 1   Chief Complaint  Patient presents with  . Fall        Subjective:   [name] today has, No headache, No chest pain, No abdominal pain - No Nausea, No new weakness tingling or numbness, No Cough - Improved SOB.    Assessment & Plan    1. Mechanical fall with left-sided pelvic fracture. Supportive care with pain control, PT evaluation, patient and family expressed their wishes to be discharged with home health.   2. Acute on chronic systolic heart failure EF 40%. Repeat echo pending. Switch to IV Lasix, continue ACE inhibitor, continue hydralazine, added low-dose beta blocker. Fluid restriction, intake output and daily weights.   3. Chronic kidney disease stage IV. Baseline creatinine around 1.5. Monitor.   4. Atrial fibrillation. Goal will be rate controlled, low-dose beta blocker, Coumadin per pharmacy.[place] Vasc 2 - 4   5. DM type II. Continue low dose sliding scale.  Lab Results  Component Value Date   HGBA1C 8.3* 07/06/2014    CBG (last 3)   Recent Labs  08/21/14 0808 08/21/14 1204 08/21/14 1225  GLUCAP 78 61* 78    6. GERD. PPI.   7. Mildly elevated troponin. Likely demand ischemia from CHF, chest pain-free, old left bundle branch block on EKG, echo to evaluate wall motion, discussed with cardiologist Dr. Melburn Popper who agrees with medical management and has nothing to add, patient currently on aspirin, low-dose beta blocker and statin. Will monitor.   8. Dyslipidemia. Continue statin.    Code Status: Full  Family Communication: family  bedside  Disposition Plan: HHPT   Procedures TTE   Consults   Dr Rometta Emery Phone   Medications  Scheduled Meds: . aspirin EC  81 mg Oral Daily  . cholecalciferol  1,000 Units Oral Daily  . docusate sodium  100 mg Oral QHS  . enalapril  20 mg Oral Daily  . famotidine  20 mg Oral Daily  . furosemide  40 mg Intravenous BID  . gabapentin  600 mg Oral BID  . hydrALAZINE  25 mg Oral TID  . insulin aspart  0-9 Units Subcutaneous TID WC  . metoprolol tartrate  12.5 mg Oral BID  . nitroGLYCERIN  1 inch Topical 3 times per day  . simvastatin  40 mg Oral QHS  . sodium chloride  3 mL Intravenous Q12H  . tiZANidine  2 mg Oral BID  . vitamin B-12  1,000 mcg Oral Daily  . vitamin C  500 mg Oral Daily  . Warfarin - Pharmacist Dosing Inpatient   Does not apply Q24H   Continuous Infusions:  PRN Meds:.sodium chloride, acetaminophen **OR** acetaminophen, hydrALAZINE, morphine injection, nitroGLYCERIN, sodium chloride  DVT Prophylaxis  Coumadin  Lab Results  Component Value Date   PLT 125* 08/20/2014    Antibiotics     Anti-infectives    None          Objective:   Filed Vitals:   08/21/14 0615 08/21/14 0721 08/21/14 1040 08/21/14 1043  BP:  166/83 147/91 147/91  Pulse: 101 96    Temp:  97.5 F (36.4 C)    TempSrc:  Oral    Resp: 26 24    Height:  6' (1.829 m)    SpO2: 89% 91%      Wt Readings from Last 3 Encounters:  08/17/14 85.73 kg (189 lb)  01/04/14 90.629 kg (199 lb 12.8 oz)  07/05/13 90.629 kg (199 lb 12.8 oz)     Intake/Output Summary (Last 24 hours) at 08/21/14 1340 Last data filed at 08/21/14 1102  Gross per 24 hour  Intake      0 ml  Output    200 ml  Net   -200 ml     Physical Exam  Awake Alert, Oriented X 3, No new F.N deficits, Normal affect [place].AT,PERRAL Supple Neck, + JVD, No cervical lymphadenopathy appriciated.  Symmetrical Chest wall movement, Good air movement bilaterally, +ve rales RRR,No Gallops,Rubs or new Murmurs, No Parasternal  Heave +ve B.Sounds, Abd Soft, No tenderness, No organomegaly appriciated, No rebound - guarding or rigidity. No Cyanosis, Clubbing or edema, No new Rash or bruise , Bilat BKA   Data Review   Micro Results No results found for this or any previous visit (from the past 240 hour(s)).  Radiology Reports Dg Chest 1 View  08/20/2014   CLINICAL DATA:  Status post fall 1 day ago.  Bilateral hip pain.  EXAM: CHEST - 1 VIEW  COMPARISON:  08/18/2014  FINDINGS: Mild bilateral interstitial thickening. Moderate right and trace left pleural effusions. Bilateral central vascular prominence. No pneumothorax. Stable cardiomegaly. Prior CABG. No acute osseous abnormality.  IMPRESSION: Overall findings concerning for mild CHF.   Electronically Signed   By: Elige Ko   On: 08/20/2014 20:03   Ct Head Wo Contrast  08/20/2014   CLINICAL DATA:  Status post fall 08/19/2014 with a blow to the head. The patient is anticoagulated.  EXAM: CT HEAD WITHOUT CONTRAST  TECHNIQUE: Contiguous axial images were obtained from the base of the skull through the vertex without intravenous contrast.  COMPARISON:  Head CT scan 07/06/2014.  FINDINGS: The brain is atrophic with chronic microvascular ischemic change. Large, remote right parietal and temporal lobe infarct is identified. There is no evidence of acute abnormality in infarction, hemorrhage, mass lesion, mass effect, midline shift or abnormal extra-axial fluid collection. No hydrocephalus or pneumocephalus. The calvarium is intact. Imaged paranasal sinuses and mastoid air cells are clear.  IMPRESSION: No acute abnormality.  Atrophy, chronic microvascular ischemic change and remote right temporal and parietal lobe infarct.   Electronically Signed   By: Drusilla Kanner M.D.   On: 08/20/2014 19:42   Dg Hips Bilat With Pelvis 2v  08/20/2014   CLINICAL DATA:  Status post fall.  Left hip pain.  EXAM: DG HIP W/ PELVIS 2V BILAT  COMPARISON:  None.  FINDINGS: There is severe osteopenia.  There is no hip fracture or dislocation. There is a nondisplaced fracture of the left inferior pubic ramus.  There is peripheral vascular atherosclerotic disease.  IMPRESSION: 1. No hip fracture or dislocation. 2. Nondisplaced fracture of the left inferior pubic ramus.   Electronically Signed   By: Elige Ko   On: 08/20/2014 20:05     CBC  Recent Labs Lab 08/20/14 1900  WBC 6.7  HGB 12.4*  HCT 39.6  PLT 125*  MCV 101.5*  MCH 31.8  MCHC 31.3  RDW 13.9  LYMPHSABS 0.6*  MONOABS 0.7  EOSABS 0.1  BASOSABS 0.0  Chemistries   Recent Labs Lab 08/20/14 1900  NA 143  K 4.2  CL 109  CO2 29  GLUCOSE 142*  BUN 37*  CREATININE 1.56*  CALCIUM 8.5  AST 25  ALT 31  ALKPHOS 57  BILITOT 1.0   ------------------------------------------------------------------------------------------------------------------ estimated creatinine clearance is 35.2 mL/min (by C-G formula based on Cr of 1.56). ------------------------------------------------------------------------------------------------------------------ No results for input(s): HGBA1C in the last 72 hours. ------------------------------------------------------------------------------------------------------------------  Recent Labs  08/21/14 0441  CHOL 106  HDL 32*  LDLCALC 55  TRIG 94  CHOLHDL 3.3   ------------------------------------------------------------------------------------------------------------------  Recent Labs  08/21/14 0441  TSH 3.771   ------------------------------------------------------------------------------------------------------------------ No results for input(s): VITAMINB12, FOLATE, FERRITIN, TIBC, IRON, RETICCTPCT in the last 72 hours.  Coagulation profile  Recent Labs Lab 08/20/14 1900 08/21/14 0441  INR 1.42 1.35    No results for input(s): DDIMER in the last 72 hours.  Cardiac Enzymes  Recent Labs Lab 08/20/14 1900 08/20/14 2145 08/21/14 0446  TROPONINI 0.32* 0.38*  0.36*   ------------------------------------------------------------------------------------------------------------------ Invalid input(s): POCBNP     Time Spent in minutes   35   Genisis Sonnier K M.D on 08/21/2014 at 1:40 PM  Between 7am to 7pm - Pager - [phone]  After 7pm go to [email or web address] - password [email or web address]  [facility] Office  [phone]

## 2014-08-22 LAB — BASIC METABOLIC PANEL
ANION GAP: 4 — AB (ref 5–15)
BUN: 37 mg/dL — ABNORMAL HIGH (ref 6–23)
CO2: 34 mmol/L — ABNORMAL HIGH (ref 19–32)
Calcium: 8.3 mg/dL — ABNORMAL LOW (ref 8.4–10.5)
Chloride: 100 mmol/L (ref 96–112)
Creatinine, Ser: 2.09 mg/dL — ABNORMAL HIGH (ref 0.50–1.35)
GFR calc Af Amer: 31 mL/min — ABNORMAL LOW (ref >90)
GFR, EST NON AFRICAN AMERICAN: 26 mL/min — AB (ref >90)
Glucose, Bld: 309 mg/dL — ABNORMAL HIGH (ref 70–99)
Potassium: 4.8 mmol/L (ref 3.5–5.1)
Sodium: 138 mmol/L (ref 135–145)

## 2014-08-22 LAB — GLUCOSE, CAPILLARY
GLUCOSE-CAPILLARY: 216 mg/dL — AB (ref 70–99)
GLUCOSE-CAPILLARY: 261 mg/dL — AB (ref 70–99)
GLUCOSE-CAPILLARY: 264 mg/dL — AB (ref 70–99)
GLUCOSE-CAPILLARY: 317 mg/dL — AB (ref 70–99)
Glucose-Capillary: 296 mg/dL — ABNORMAL HIGH (ref 70–99)

## 2014-08-22 LAB — PROTIME-INR
INR: 1.66 — AB (ref 0.00–1.49)
Prothrombin Time: 19.8 seconds — ABNORMAL HIGH (ref 11.6–15.2)

## 2014-08-22 LAB — HEMOGLOBIN A1C
Hgb A1c MFr Bld: 8.3 % — ABNORMAL HIGH (ref 4.8–5.6)
MEAN PLASMA GLUCOSE: 192 mg/dL

## 2014-08-22 LAB — TROPONIN I: Troponin I: 0.25 ng/mL — ABNORMAL HIGH (ref <0.031)

## 2014-08-22 MED ORDER — HYDRALAZINE HCL 50 MG PO TABS
50.0000 mg | ORAL_TABLET | Freq: Three times a day (TID) | ORAL | Status: DC
  Administered 2014-08-22: 50 mg via ORAL
  Filled 2014-08-22 (×3): qty 1

## 2014-08-22 MED ORDER — INSULIN ASPART 100 UNIT/ML ~~LOC~~ SOLN
0.0000 [IU] | Freq: Three times a day (TID) | SUBCUTANEOUS | Status: DC
  Administered 2014-08-22: 5 [IU] via SUBCUTANEOUS
  Administered 2014-08-22: 7 [IU] via SUBCUTANEOUS
  Administered 2014-08-23: 3 [IU] via SUBCUTANEOUS
  Administered 2014-08-23: 2 [IU] via SUBCUTANEOUS
  Administered 2014-08-23: 5 [IU] via SUBCUTANEOUS
  Administered 2014-08-24: 2 [IU] via SUBCUTANEOUS
  Administered 2014-08-24: 9 [IU] via SUBCUTANEOUS
  Administered 2014-08-24: 5 [IU] via SUBCUTANEOUS
  Administered 2014-08-25: 7 [IU] via SUBCUTANEOUS
  Administered 2014-08-25: 5 [IU] via SUBCUTANEOUS
  Administered 2014-08-25: 2 [IU] via SUBCUTANEOUS

## 2014-08-22 MED ORDER — WARFARIN SODIUM 5 MG PO TABS
5.0000 mg | ORAL_TABLET | Freq: Once | ORAL | Status: AC
  Administered 2014-08-22: 5 mg via ORAL
  Filled 2014-08-22: qty 1

## 2014-08-22 MED ORDER — INSULIN ASPART 100 UNIT/ML ~~LOC~~ SOLN
0.0000 [IU] | Freq: Every day | SUBCUTANEOUS | Status: DC
  Administered 2014-08-22: 3 [IU] via SUBCUTANEOUS
  Administered 2014-08-23: 4 [IU] via SUBCUTANEOUS
  Administered 2014-08-24: 2 [IU] via SUBCUTANEOUS

## 2014-08-22 MED ORDER — HYDROCODONE-ACETAMINOPHEN 5-325 MG PO TABS
1.0000 | ORAL_TABLET | ORAL | Status: DC | PRN
  Administered 2014-08-22 – 2014-08-24 (×5): 1 via ORAL
  Filled 2014-08-22 (×6): qty 1

## 2014-08-22 MED ORDER — INSULIN GLARGINE 100 UNIT/ML ~~LOC~~ SOLN
10.0000 [IU] | Freq: Every day | SUBCUTANEOUS | Status: DC
  Administered 2014-08-22 – 2014-08-24 (×3): 10 [IU] via SUBCUTANEOUS
  Filled 2014-08-22 (×4): qty 0.1

## 2014-08-22 MED ORDER — NEPRO/CARBSTEADY PO LIQD
237.0000 mL | Freq: Two times a day (BID) | ORAL | Status: DC
  Administered 2014-08-23 – 2014-08-24 (×3): 237 mL via ORAL
  Filled 2014-08-22 (×8): qty 237

## 2014-08-22 MED ORDER — HYDRALAZINE HCL 25 MG PO TABS
25.0000 mg | ORAL_TABLET | Freq: Three times a day (TID) | ORAL | Status: DC
Start: 2014-08-22 — End: 2014-08-25
  Administered 2014-08-22 – 2014-08-25 (×8): 25 mg via ORAL
  Filled 2014-08-22 (×11): qty 1

## 2014-08-22 NOTE — Clinical Documentation Improvement (Signed)
Do you agree with the DG HIPS BILAT WITH PELVIS 2V 08/20/2014 specifying pt with "severe osteopenia?"  Please document your response in the health record or D/C summary.   Yes____________ No ____________ Other ___________ Clinically Undetermined __________    Supporting Information: Risk Factors: Fall with Pubic Rami fracture, ARF, Acute on Chronic systolic HF,   Treatment  Monitoring  Thank You, Enis SlipperLolita J Gehrig Patras ,RN Clinical Documentation Specialist:  [phone]  [facility]- Health Information Management

## 2014-08-22 NOTE — Progress Notes (Signed)
ANTICOAGULATION CONSULT NOTE Pharmacy Consult for Coumadin Indication: atrial fibrillation  Allergies  Allergen Reactions  . Cephalexin Other (See Comments)    Unknown   . Ciprofloxacin Other (See Comments)    Unknown     Patient Measurements: Height: 6' (182.9 cm) Weight: 187 lb 13.3 oz (85.2 kg) IBW/kg (Calculated) : 77.6  Vital Signs: Temp: 97.8 F (36.6 C) (02/01 0401) Temp Source: Oral (02/01 0401) BP: 102/73 mmHg (02/01 0401) Pulse Rate: 92 (02/01 0401)  Labs:  Recent Labs  08/20/14 1900  08/21/14 0441  08/21/14 1508 08/21/14 1825 08/22/14 0105  HGB 12.4*  --   --   --   --   --   --   HCT 39.6  --   --   --   --   --   --   PLT 125*  --   --   --   --   --   --   LABPROT 17.5*  --  16.8*  --  17.8*  --  19.8*  INR 1.42  --  1.35  --  1.46  --  1.66*  CREATININE 1.56*  --   --   --   --   --  2.09*  TROPONINI 0.32*  < >  --   < > 0.28* 0.24* 0.25*  < > = values in this interval not displayed.  Estimated Creatinine Clearance: 26.3 mL/min (by C-G formula based on Cr of 2.09).   Assessment: 81 yo M presented to [facility] ED after fall resulting in pubic rami fracture.  Of note, pt was supposed to be on Coumadin PTA for hx of afib.  MD notes document this but family states the patient has not had any doses in the last week, supported by his subtherapeutic INR on admission.  To restart Coumadin.  INR on admission = 1.35 INR today = 1.66  Goal of Therapy:  INR 2-3 Monitor platelets by anticoagulation protocol: Yes   Plan:  Repeat Coumadin 5mg  PO x 1 tonight. Daily INR.  Thank you. Okey RegalLisa Kandis Henry, PharmD [phone]  08/22/2014 9:46 AM

## 2014-08-22 NOTE — Progress Notes (Signed)
INITIAL NUTRITION ASSESSMENT  DOCUMENTATION CODES Per approved criteria  -Not Applicable   INTERVENTION: Add Dysphagia 3 (Mechanical Soft) to Heart Healthy diet order Provide Nepro Shake (lower in potassium) po BID after meals, each supplement provides 425 kcal and 19 grams protein RD to continue to monitor for PO adequacy/additional nutritional needs   NUTRITION DIAGNOSIS: Inadequate oral intake related to swallowing difficulty and varied appetite as evidenced by meal completion ranging 0-75% and 6% recent weight loss.   Goal: Pt to meet >/= 90% of their estimated nutrition needs   Monitor:  PO intake, weight trend, I/O's, labs  Reason for Assessment: RN consult (poor PO intake, elevated glucose)  81 y.o. male  Admitting Dx: Pelvic fracture  ASSESSMENT: 81 yo male with hx of Afib, CAD s/p CABG, Dm2, apparently c/o fall while transferring to chair to walker and lost balance and fell backwards.Pt was found in ED to have uncontrolled HTN and CXR showed CHF.Admission to [facility] for pubi rami fracture and CHF (EF 40%) mild on CXR.   Pt awoke for a couple minutes but, fell back asleep during RD visit; pt's wife at bedside assisted in providing history. Per pt's wife pt has had difficulty swallowing, especially meats, since August. Wife has been preparing/cooking soft foods but, pt still eats less than he used to, causing low blood sugar at time. Wife has tried nutritional supplements but, due to their high vitamin K and potassium content, pt had to stop drinking them. Pt had noticeable wasting of arms and clavicles. Per weight history pt has lost 6% of his body weight since June- wt loss not significant for time frame.  Per RN, pt is on a 1.2 L fluid restriction but, family brought pt a large mountain dew this morning. Per nursing notes, pt refused breakfast this morning, at 50% of his lunch, and ate 25-75% of meals yesterday.   Labs:glucose ranging 61 to 317 mg/dL, low GFR, low  calcium, high PT  Height: Ht Readings from Last 1 Encounters:  08/21/14 6' (1.829 m)    Weight: Wt Readings from Last 1 Encounters:  08/22/14 187 lb 13.3 oz (85.2 kg)    Ideal Body Weight: 178 lbs  % Ideal Body Weight: 105%  Wt Readings from Last 10 Encounters:  08/22/14 187 lb 13.3 oz (85.2 kg)  08/17/14 189 lb (85.73 kg)  01/04/14 199 lb 12.8 oz (90.629 kg)  07/05/13 199 lb 12.8 oz (90.629 kg)  01/14/13 199 lb (90.266 kg)  07/07/12 192 lb (87.091 kg)  12/12/11 192 lb 12.8 oz (87.454 kg)  06/11/11 189 lb 12.8 oz (86.093 kg)  07/30/10 190 lb (86.183 kg)  08/07/07 199 lb 6.1 oz (90.439 kg)    Usual Body Weight: 199 lbs  % Usual Body Weight: 94%  BMI:  Body mass index is 25.47 kg/(m^2).  Estimated Nutritional Needs: Kcal: 1800-2000 Protein: 80-90 grams Fluid: 1.2 L/ day restriction per MD  Skin: intact  Diet Order: Diet Heart  EDUCATION NEEDS: -No education needs identified at this time   Intake/Output Summary (Last 24 hours) at 08/22/14 1441 Last data filed at 08/22/14 1414  Gross per 24 hour  Intake   1574 ml  Output    975 ml  Net    599 ml    Last BM: 1/31  Labs:   Recent Labs Lab 08/20/14 1900 08/22/14 0105  NA 143 138  K 4.2 4.8  CL 109 100  CO2 29 34*  BUN 37* 37*  CREATININE 1.56* 2.09*  CALCIUM 8.5 8.3*  GLUCOSE 142* 309*    CBG (last 3)   Recent Labs  08/22/14 0007 08/22/14 0404 08/22/14 1121  GLUCAP 261* 216* 317*    Scheduled Meds: . aspirin EC  81 mg Oral Daily  . cholecalciferol  1,000 Units Oral Daily  . docusate sodium  100 mg Oral QHS  . famotidine  20 mg Oral Daily  . gabapentin  600 mg Oral BID  . hydrALAZINE  50 mg Oral TID  . insulin aspart  0-5 Units Subcutaneous QHS  . insulin aspart  0-9 Units Subcutaneous TID WC  . insulin glargine  10 Units Subcutaneous Daily  . simvastatin  40 mg Oral QHS  . sodium chloride  3 mL Intravenous Q12H  . tiZANidine  2 mg Oral BID  . vitamin B-12  1,000 mcg Oral Daily   . vitamin C  500 mg Oral Daily  . warfarin  5 mg Oral ONCE-1800  . Warfarin - Pharmacist Dosing Inpatient   Does not apply Q24H    Continuous Infusions:   Past Medical History  Diagnosis Date  . Atrial fibrillation   . Coronary atherosclerosis of native coronary artery     Multivessel  . TIA (transient ischemic attack)   . Anemia   . GI bleed   . Diabetes type 2, controlled   . Hyperlipidemia   . Peripheral arterial disease   . Essential hypertension, benign   . Partial deafness   . MI (myocardial infarction)   . S/P bilateral BKA (below knee amputation)   . Complete heart block     On sotalol  . First degree heart block   . Mitral regurgitation     mild-moderate    Past Surgical History  Procedure Laterality Date  . Coronary artery bypass graft  1994  . Transurethral resection of prostate    . Below knee leg amputation      Bilateral  . Cataract extraction      Ian Malkineanne Barnett RD, LDN Inpatient Clinical Dietitian Pager: [phone] After Hours Pager: [phone]

## 2014-08-22 NOTE — Plan of Care (Signed)
Problem: Phase I Progression Outcomes Goal: Hemodynamically stable Outcome: Progressing Patient remains stable this shift.  Continuous telemetry displayed brief episodes of bradycardia into the 30s.  Asymptomatic.  Remains in Atrial fib.  VSS.  O2 sats in 90s on 5L nasal cannula.  Will continue to monitor patient condition.

## 2014-08-22 NOTE — Evaluation (Signed)
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 Pt also with pneumonia during December.  Clinical Impression  Pt admitted with above diagnosis. Pt currently with functional limitations due to the deficits listed below (see PT Problem List).  Pt will benefit from skilled PT to increase their independence and safety with mobility to allow discharge to the venue listed below.    Discussed with wife and son d/c options of HHPT vs. SNF.  Discussed typical frequency of therapy in each setting.  They would like to take pt home and do have supportive system with DME, but son also understands that pt could benefit from short term rehab to maximize his mobility prior to coming home.  Currently he is MAX of 2 with bed mobility.  Son stated that whatever is best for the patient is what they want to do.  Family is going to discuss, but understand PT recommendation of SNF based on objective findings, age, and recent pneumonia.     Follow Up Recommendations SNF    Equipment Recommendations  None recommended by PT    Recommendations for Other Services       Precautions / Restrictions Precautions Precautions: Fall Precaution Comments: B BKA Restrictions Weight Bearing Restrictions: Yes LLE Weight Bearing: Weight bearing as tolerated Other Position/Activity Restrictions: when pt is able to tolerate donning prosthesis      Mobility  Bed Mobility Overal bed mobility: Needs Assistance;+2 for physical assistance Bed Mobility: Supine to Sit;Sit to Supine     Supine to sit: Max assist;+2 for physical assistance Sit to supine: Max assist;+2 for physical assistance   General bed mobility comments: Pt attempting to help with use of rail. Required use of pad to position patient safely at EOB to promote safety due  to B BKA.  Transfers                    Ambulation/Gait                Stairs            Wheelchair Mobility    Modified Rankin (Stroke Patients Only)       Balance Overall balance assessment: Needs assistance Sitting-balance support: Bilateral upper extremity supported Sitting balance-Leahy Scale: Poor Sitting balance - Comments: Leans R to keep pressure off of L side due to pain but able to sit with CGA. Postural control: Right lateral lean                                   Pertinent Vitals/Pain Pain Assessment: 0-10 Pain Score: 10-Worst pain ever Pain Location: L hip Pain Descriptors / Indicators: Sore Pain Intervention(s): Limited activity within patient's tolerance;Monitored during session;Premedicated before session;Repositioned    Home Living                   Additional Comments: Pt lives in a single story home with a ramped entryway with his wife.  Family very supportive and able to assist PRN.  DME : walker, (B) BKA prosthetics, BSC x2, std cane x2, shower bench, handicap height toilet, manual W/C    Prior Function Level of Independence: Independent with assistive device(s)         Comments: Family reports pt walks  with walker with prosthesis     Hand Dominance   Dominant Hand: Right    Extremity/Trunk Assessment               Lower Extremity Assessment: RLE deficits/detail;LLE deficits/detail RLE Deficits / Details: BKA LLE Deficits / Details: BKA     Communication   Communication: HOH (hears bestout of R ear)  Cognition Arousal/Alertness: Awake/alert Behavior During Therapy: WFL for tasks assessed/performed Overall Cognitive Status: Within Functional Limits for tasks assessed                      General Comments      Exercises        Assessment/Plan    PT Assessment Patient needs continued PT services  PT Diagnosis Acute pain;Generalized weakness   PT Problem List  Decreased activity tolerance;Decreased balance;Decreased mobility;Decreased knowledge of use of DME  PT Treatment Interventions Functional mobility training;Therapeutic activities;Therapeutic exercise;Balance training;Patient/family education   PT Goals (Current goals can be found in the Care Plan section) Acute Rehab PT Goals Patient Stated Goal: wife would like to take pt home because they don't think pt would want to go to a SNF, but son states they want what is best and if he needs rehab then he needs it. PT Goal Formulation: With family Time For Goal Achievement: 09/05/14 Potential to Achieve Goals: Fair    Frequency Min 3X/week   Barriers to discharge        Co-evaluation               End of Session   Activity Tolerance: Patient limited by pain Patient left: in bed Nurse Communication: Mobility status         Time: 1610-9604 PT Time Calculation (min) (ACUTE ONLY): 19 min   Charges:   PT Evaluation $Initial PT Evaluation Tier I: 1 Procedure     PT G Codes:        Tyler Moon 08/22/2014, 12:41 PM

## 2014-08-22 NOTE — Progress Notes (Signed)
Patient Demographics  Tyler Moon, is a 81 y.o. male, DOB - 03/15/1923, ZOX:[id number]  Admit date - 08/20/2014   Admitting Physician Pearson GrippeJames Kim, MD  Outpatient Primary MD for the patient is Ernestine ConradBLUTH, KIRK, MD  LOS - 2   Chief Complaint  Patient presents with  . Fall        Subjective:   Tyler BertholdHoward Magowan today has, No headache, No chest pain, No abdominal pain - No Nausea, No new weakness tingling or numbness, No Cough - Improved SOB.    Assessment & Plan    1. Mechanical fall with left-sided pelvic fracture. Supportive care with pain control, PT evaluation, patient and family expressed their wishes to be discharged with home health. Discussed with orthopedic physician Dr. Lajoyce Cornersuda. Weightbearing as tolerated.   2. Acute on chronic systolic heart failure EF 40%. Repeat echo pending. Diuresed well with IV Lasix, is -1-1/2 L and now symptom-free, since creatinine has bumped will hold Lasix and ACE inhibitor, continue hydralazine, does not tolerate beta blocker and develops bradycardia. Fluid & salt restriction, intake output and daily weights.   3. ARF on Chronic kidney disease stage IV. Baseline creatinine around 1.8. Withhold Lasix today along with ACE inhibitor. Repeat BMP in the morning.   4. Atrial fibrillation. Goal will be rate controlled, low-dose beta blocker, Coumadin per pharmacy.[place] Vasc 2 - 4   5. DM type II. Continue low dose sliding scale.  Lab Results  Component Value Date   HGBA1C 8.3* 07/06/2014    CBG (last 3)   Recent Labs  08/21/14 1949 08/22/14 0007 08/22/14 0404  GLUCAP 294* 261* 216*    6. GERD. PPI.   7. Mildly elevated troponin. Likely demand ischemia from CHF, chest pain-free, old left bundle branch block on EKG, echo to evaluate wall motion, discussed with  cardiologist Dr. Melburn PopperNasher who agrees with medical management and has nothing to add, patient currently on aspirin and statin. Hand bradycardia with sotalol in the past and now with low-dose of 12.5 mg Lopressor here, discontinue beta blocker. Monitor clinically.    8. Dyslipidemia. Continue statin.    Code Status: Full  Family Communication: family bedside  Disposition Plan: HHPT/SNF   Procedures TTE   Consults   Dr Rometta EmeryNahsher & Dr Lajoyce Cornersuda over the Phone   Medications  Scheduled Meds: . aspirin EC  81 mg Oral Daily  . cholecalciferol  1,000 Units Oral Daily  . docusate sodium  100 mg Oral QHS  . famotidine  20 mg Oral Daily  . gabapentin  600 mg Oral BID  . hydrALAZINE  50 mg Oral TID  . insulin aspart  0-5 Units Subcutaneous QHS  . insulin aspart  0-9 Units Subcutaneous TID WC  . insulin glargine  10 Units Subcutaneous Daily  . simvastatin  40 mg Oral QHS  . sodium chloride  3 mL Intravenous Q12H  . tiZANidine  2 mg Oral BID  . vitamin B-12  1,000 mcg Oral Daily  . vitamin C  500 mg Oral Daily  . warfarin  5 mg Oral ONCE-1800  . Warfarin - Pharmacist Dosing Inpatient   Does not apply Q24H   Continuous Infusions:  PRN Meds:.sodium chloride, acetaminophen **OR** acetaminophen, guaiFENesin-codeine, hydrALAZINE, HYDROcodone-acetaminophen, nitroGLYCERIN, sodium chloride  DVT  Prophylaxis  Coumadin  Lab Results  Component Value Date   PLT 125* 08/20/2014    Antibiotics     Anti-infectives    None          Objective:   Filed Vitals:   08/21/14 2137 08/21/14 2333 08/22/14 0201 08/22/14 0401  BP:   100/54 102/73  Pulse: 68 42 98 92  Temp:   97.6 F (36.4 C) 97.8 F (36.6 C)  TempSrc:   Oral Oral  Resp:   18 18  Height:      Weight:    85.2 kg (187 lb 13.3 oz)  SpO2:   94% 100%    Wt Readings from Last 3 Encounters:  08/22/14 85.2 kg (187 lb 13.3 oz)  08/17/14 85.73 kg (189 lb)  01/04/14 90.629 kg (199 lb 12.8 oz)     Intake/Output Summary (Last 24  hours) at 08/22/14 0948 Last data filed at 08/22/14 0926  Gross per 24 hour  Intake   1336 ml  Output   1325 ml  Net     11 ml     Physical Exam  Awake Alert, Oriented X 3, No new F.N deficits, Normal affect Malone.AT,PERRAL Supple Neck, + JVD, No cervical lymphadenopathy appriciated.  Symmetrical Chest wall movement, Good air movement bilaterally, +ve rales RRR,No Gallops,Rubs or new Murmurs, No Parasternal Heave +ve B.Sounds, Abd Soft, No tenderness, No organomegaly appriciated, No rebound - guarding or rigidity. No Cyanosis, Clubbing or edema, No new Rash or bruise , Bilat BKA   Data Review   Micro Results No results found for this or any previous visit (from the past 240 hour(s)).  Radiology Reports Dg Chest 1 View  08/20/2014   CLINICAL DATA:  Status post fall 1 day ago.  Bilateral hip pain.  EXAM: CHEST - 1 VIEW  COMPARISON:  08/18/2014  FINDINGS: Mild bilateral interstitial thickening. Moderate right and trace left pleural effusions. Bilateral central vascular prominence. No pneumothorax. Stable cardiomegaly. Prior CABG. No acute osseous abnormality.  IMPRESSION: Overall findings concerning for mild CHF.   Electronically Signed   By: Elige Ko   On: 08/20/2014 20:03   Ct Head Wo Contrast  08/20/2014   CLINICAL DATA:  Status post fall 08/19/2014 with a blow to the head. The patient is anticoagulated.  EXAM: CT HEAD WITHOUT CONTRAST  TECHNIQUE: Contiguous axial images were obtained from the base of the skull through the vertex without intravenous contrast.  COMPARISON:  Head CT scan 07/06/2014.  FINDINGS: The brain is atrophic with chronic microvascular ischemic change. Large, remote right parietal and temporal lobe infarct is identified. There is no evidence of acute abnormality in infarction, hemorrhage, mass lesion, mass effect, midline shift or abnormal extra-axial fluid collection. No hydrocephalus or pneumocephalus. The calvarium is intact. Imaged paranasal sinuses and mastoid  air cells are clear.  IMPRESSION: No acute abnormality.  Atrophy, chronic microvascular ischemic change and remote right temporal and parietal lobe infarct.   Electronically Signed   By: Drusilla Kanner M.D.   On: 08/20/2014 19:42   Dg Hips Bilat With Pelvis 2v  08/20/2014   CLINICAL DATA:  Status post fall.  Left hip pain.  EXAM: DG HIP W/ PELVIS 2V BILAT  COMPARISON:  None.  FINDINGS: There is severe osteopenia. There is no hip fracture or dislocation. There is a nondisplaced fracture of the left inferior pubic ramus.  There is peripheral vascular atherosclerotic disease.  IMPRESSION: 1. No hip fracture or dislocation. 2. Nondisplaced fracture of the left inferior  pubic ramus.   Electronically Signed   By: Elige Ko   On: 08/20/2014 20:05     CBC  Recent Labs Lab 08/20/14 1900  WBC 6.7  HGB 12.4*  HCT 39.6  PLT 125*  MCV 101.5*  MCH 31.8  MCHC 31.3  RDW 13.9  LYMPHSABS 0.6*  MONOABS 0.7  EOSABS 0.1  BASOSABS 0.0    Chemistries   Recent Labs Lab 08/20/14 1900 08/22/14 0105  NA 143 138  K 4.2 4.8  CL 109 100  CO2 29 34*  GLUCOSE 142* 309*  BUN 37* 37*  CREATININE 1.56* 2.09*  CALCIUM 8.5 8.3*  AST 25  --   ALT 31  --   ALKPHOS 57  --   BILITOT 1.0  --    ------------------------------------------------------------------------------------------------------------------ estimated creatinine clearance is 26.3 mL/min (by C-G formula based on Cr of 2.09). ------------------------------------------------------------------------------------------------------------------ No results for input(s): HGBA1C in the last 72 hours. ------------------------------------------------------------------------------------------------------------------  Recent Labs  08/21/14 0441  CHOL 106  HDL 32*  LDLCALC 55  TRIG 94  CHOLHDL 3.3   ------------------------------------------------------------------------------------------------------------------  Recent Labs  08/21/14 0441    TSH 3.771   ------------------------------------------------------------------------------------------------------------------ No results for input(s): VITAMINB12, FOLATE, FERRITIN, TIBC, IRON, RETICCTPCT in the last 72 hours.  Coagulation profile  Recent Labs Lab 08/20/14 1900 08/21/14 0441 08/21/14 1508 08/22/14 0105  INR 1.42 1.35 1.46 1.66*    No results for input(s): DDIMER in the last 72 hours.  Cardiac Enzymes  Recent Labs Lab 08/21/14 1508 08/21/14 1825 08/22/14 0105  TROPONINI 0.28* 0.24* 0.25*   ------------------------------------------------------------------------------------------------------------------ Invalid input(s): POCBNP     Time Spent in minutes   35   Michaline Kindig K M.D on 08/22/2014 at 9:48 AM  Between 7am to 7pm - Pager - [phone]  After 7pm go to [email or web address] - password [facility]  [facility] Office  [phone]

## 2014-08-23 ENCOUNTER — Encounter (HOSPITAL_COMMUNITY): Payer: Self-pay | Admitting: General Practice

## 2014-08-23 LAB — GLUCOSE, CAPILLARY
GLUCOSE-CAPILLARY: 238 mg/dL — AB (ref 70–99)
GLUCOSE-CAPILLARY: 257 mg/dL — AB (ref 70–99)
Glucose-Capillary: 165 mg/dL — ABNORMAL HIGH (ref 70–99)
Glucose-Capillary: 321 mg/dL — ABNORMAL HIGH (ref 70–99)

## 2014-08-23 LAB — BASIC METABOLIC PANEL
ANION GAP: 7 (ref 5–15)
BUN: 47 mg/dL — ABNORMAL HIGH (ref 6–23)
CALCIUM: 8.2 mg/dL — AB (ref 8.4–10.5)
CHLORIDE: 100 mmol/L (ref 96–112)
CO2: 31 mmol/L (ref 19–32)
CREATININE: 2.17 mg/dL — AB (ref 0.50–1.35)
GFR, EST AFRICAN AMERICAN: 29 mL/min — AB (ref >90)
GFR, EST NON AFRICAN AMERICAN: 25 mL/min — AB (ref >90)
Glucose, Bld: 180 mg/dL — ABNORMAL HIGH (ref 70–99)
Potassium: 4.2 mmol/L (ref 3.5–5.1)
Sodium: 138 mmol/L (ref 135–145)

## 2014-08-23 LAB — HEMOGLOBIN A1C
Hgb A1c MFr Bld: 8.2 % — ABNORMAL HIGH (ref 4.8–5.6)
Mean Plasma Glucose: 189 mg/dL

## 2014-08-23 LAB — PROTIME-INR
INR: 2.17 — ABNORMAL HIGH (ref 0.00–1.49)
PROTHROMBIN TIME: 24.4 s — AB (ref 11.6–15.2)

## 2014-08-23 MED ORDER — WARFARIN 0.5 MG HALF TABLET
0.5000 mg | ORAL_TABLET | Freq: Once | ORAL | Status: AC
Start: 2014-08-23 — End: 2014-08-23
  Administered 2014-08-23: 0.5 mg via ORAL
  Filled 2014-08-23: qty 1

## 2014-08-23 MED ORDER — FUROSEMIDE 40 MG PO TABS
40.0000 mg | ORAL_TABLET | Freq: Every day | ORAL | Status: DC
  Administered 2014-08-23 – 2014-08-24 (×2): 40 mg via ORAL
  Filled 2014-08-23 (×3): qty 1

## 2014-08-23 NOTE — Progress Notes (Signed)
Physical Therapy Treatment Patient Details Name: Karleen DolphinHoward R Guay MRN: [id number] DOB: 01/09/1923 Today's Date: 08/23/2014    History of Present Illness Pt with bil BKA who fell from w/c during transfer to walker and suffered L pubic rami fracture. Pt also with pneumonia during December.    PT Comments    Pt making steady progress.  Follow Up Recommendations  SNF     Equipment Recommendations  None recommended by PT    Recommendations for Other Services       Precautions / Restrictions Precautions Precautions: Fall Precaution Comments: B BKA Restrictions LLE Weight Bearing: Weight bearing as tolerated Other Position/Activity Restrictions: when pt is able to tolerate donning prosthesis    Mobility  Bed Mobility Overal bed mobility: Needs Assistance Bed Mobility: Sit to Supine     Sit to supine: Min assist;HOB elevated   General bed mobility comments: Assist to control descent of trunk.  Transfers Overall transfer level: Needs assistance   Transfers: Licensed conveyancerAnterior-Posterior Transfer       Anterior-Posterior transfers: +2 physical assistance;Max assist   General transfer comment: Assisted pt using bed pad. Recliner to bed in anterior direction. More assist needed due to bed a few inches higher than the recliner. Pt able to assist using UE's.  Ambulation/Gait                 Stairs            Wheelchair Mobility    Modified Rankin (Stroke Patients Only)       Balance   Sitting-balance support: Bilateral upper extremity supported;Feet unsupported Sitting balance-Leahy Scale: Poor Sitting balance - Comments: Able to sit upright in bed with min guard assist but requires use of upper extremities.                            Cognition Arousal/Alertness: Awake/alert Behavior During Therapy: WFL for tasks assessed/performed Overall Cognitive Status: Within Functional Limits for tasks assessed                      Exercises      General Comments        Pertinent Vitals/Pain Pain Assessment: 0-10 Pain Score: 9  Pain Location: lt hip Pain Descriptors / Indicators: Sore Pain Intervention(s): Limited activity within patient's tolerance;Monitored during session;Repositioned    Home Living                      Prior Function            PT Goals (current goals can now be found in the care plan section) Progress towards PT goals: Progressing toward goals    Frequency  Min 3X/week    PT Plan Current plan remains appropriate    Co-evaluation             End of Session   Activity Tolerance: Patient tolerated treatment well Patient left: in bed;with call bell/phone within reach;with family/visitor present     Time: 1001-1017 PT Time Calculation (min) (ACUTE ONLY): 16 min  Charges:  $Wheel Chair Management: 8-22 mins                    G Codes:      Caden Fukushima 08/23/2014, 12:20 PM  [employer] Franki Alca[employer] PT [phone]

## 2014-08-23 NOTE — Progress Notes (Signed)
Physical Therapy Treatment Patient Details Name: Tyler DolphinHoward R Narayanan MRN: [id number] DOB: 04/18/1923 Today's Date: 08/23/2014    History of Present Illness Pt with bil BKA who fell from w/c during transfer to walker and suffered L pubic rami fracture. Pt also with pneumonia during December.    PT Comments    Pt making slow, steady progress.   Follow Up Recommendations  SNF     Equipment Recommendations  None recommended by PT    Recommendations for Other Services       Precautions / Restrictions Precautions Precautions: Fall Precaution Comments: B BKA Restrictions LLE Weight Bearing: Weight bearing as tolerated Other Position/Activity Restrictions: when pt is able to tolerate donning prosthesis    Mobility  Bed Mobility Overal bed mobility: Needs Assistance Bed Mobility: Supine to Sit     Supine to sit: Max assist     General bed mobility comments: Assist to move LLE and to elevate trunk. Pt able to assist bringing trunk up.  Transfers Overall transfer level: Needs assistance   Transfers: Licensed conveyancerAnterior-Posterior Transfer       Anterior-Posterior transfers: +2 physical assistance;Mod assist   General transfer comment: Pt able to assist by scooting using UE's to push. Assisted pt using bed pad.  Ambulation/Gait                 Stairs            Wheelchair Mobility    Modified Rankin (Stroke Patients Only)       Balance   Sitting-balance support: Bilateral upper extremity supported;Feet unsupported Sitting balance-Leahy Scale: Poor Sitting balance - Comments: Able to sit on EOB with min guard assist but requires use of upper extremities.                            Cognition Arousal/Alertness: Awake/alert Behavior During Therapy: WFL for tasks assessed/performed Overall Cognitive Status: Within Functional Limits for tasks assessed                      Exercises      General Comments        Pertinent Vitals/Pain Pain  Assessment: 0-10 Pain Score: 9  Pain Location: lt hip Pain Descriptors / Indicators: Sore Pain Intervention(s): Limited activity within patient's tolerance;Monitored during session;Repositioned    Home Living                      Prior Function            PT Goals (current goals can now be found in the care plan section) Progress towards PT goals: Progressing toward goals    Frequency  Min 3X/week    PT Plan Current plan remains appropriate    Co-evaluation             End of Session   Activity Tolerance: Patient tolerated treatment well Patient left: with call bell/phone within reach;in chair;with family/visitor present     Time: 1001-1017 PT Time Calculation (min) (ACUTE ONLY): 16 min  Charges:  $Wheel Chair Management: 8-22 mins                    G Codes:      Tyse Auriemma 08/23/2014, 12:16 PM  [employer] Dalvin Clipper PT [phone]

## 2014-08-23 NOTE — Progress Notes (Signed)
Patient Demographics  Tyler Moon, is a 81 y.o. male, DOB - 02/17/1923, WGN:[id number]  Admit date - 08/20/2014   Admitting Physician Pearson GrippeJames Kim, MD  Outpatient Primary MD for the patient is Ernestine ConradBLUTH, KIRK, MD  LOS - 3   Chief Complaint  Patient presents with  . Fall        Subjective:   Tyler Moon today has, No headache, No chest pain, No abdominal pain - No Nausea, No new weakness tingling or numbness, No Cough - Improved SOB.    Assessment & Plan    1. Mechanical fall with left-sided pelvic fracture. Supportive care with pain control, Discussed with orthopedic physician Dr. Lajoyce Cornersuda. Weightbearing as tolerated. PT evaluation, patient and family expressed their wishes to be discharged with home health now ? SNF. S work called.  2. Acute on chronic systolic heart failure EF 40%. Repeat echo pending. Diuresed well with IV Lasix day1, now symptom-free, since creatinine has bumped will hold ACE inhibitor, Lasix was held on 08/22/2014, now few rales we'll give low-dose oral Lasix on 08/23/2014, continue hydralazine, does not tolerate beta blocker and develops bradycardia. Fluid & salt restriction, intake output and daily weights.    3. ARF on Chronic kidney disease stage IV. Baseline creatinine around 1.8. Hold ACE inhibitor. Low dose Po lasix as mild fluid OD today, repeat BMP in the morning.   4. Atrial fibrillation. Goal will be rate controlled, low-dose beta blocker, Coumadin per pharmacy.[place] Vasc 2 - 4   5. DM type II. Continue low dose sliding scale + Lantus  Lab Results  Component Value Date   HGBA1C 8.2* 08/22/2014    CBG (last 3)   Recent Labs  08/22/14 1619 08/22/14 2115 08/23/14 0623  GLUCAP 296* 264* 165*    6. GERD. PPI.   7. Mildly elevated troponin. Likely demand  ischemia from CHF, chest pain-free, old left bundle branch block on EKG, echo to evaluate wall motion, discussed with cardiologist Dr. Melburn PopperNasher who agrees with medical management and has nothing to add, patient currently on aspirin and statin. Had bradycardia with sotalol in the past and now with low-dose of 12.5 mg Lopressor here, discontinue beta blocker. Monitor clinically.    8. Dyslipidemia. Continue statin.    Code Status: Full  Family Communication: family bedside  Disposition Plan: HHPT/SNF   Procedures TTE   Consults   Dr Rometta EmeryNahsher & Dr Lajoyce Cornersuda over the Phone   Medications  Scheduled Meds: . aspirin EC  81 mg Oral Daily  . cholecalciferol  1,000 Units Oral Daily  . docusate sodium  100 mg Oral QHS  . famotidine  20 mg Oral Daily  . feeding supplement (NEPRO CARB STEADY)  237 mL Oral BID PC  . furosemide  40 mg Oral Daily  . gabapentin  600 mg Oral BID  . hydrALAZINE  25 mg Oral TID  . insulin aspart  0-5 Units Subcutaneous QHS  . insulin aspart  0-9 Units Subcutaneous TID WC  . insulin glargine  10 Units Subcutaneous Daily  . simvastatin  40 mg Oral QHS  . sodium chloride  3 mL Intravenous Q12H  . tiZANidine  2 mg Oral BID  . vitamin B-12  1,000 mcg Oral Daily  . vitamin C  500 mg Oral Daily  . Warfarin - Pharmacist Dosing Inpatient   Does not apply Q24H   Continuous Infusions:  PRN Meds:.sodium chloride, acetaminophen **OR** acetaminophen, guaiFENesin-codeine, hydrALAZINE, HYDROcodone-acetaminophen, nitroGLYCERIN, sodium chloride  DVT Prophylaxis  Coumadin  Lab Results  Component Value Date   PLT 125* 08/20/2014    Antibiotics     Anti-infectives    None          Objective:   Filed Vitals:   08/22/14 1414 08/22/14 1746 08/22/14 2000 08/23/14 0549  BP: 99/47 131/57 129/54 131/68  Pulse: 61 62 58 58  Temp: 97.5 F (36.4 C)  97.4 F (36.3 C) 98 F (36.7 C)  TempSrc: Oral  Oral Oral  Resp: Height:      Weight:    84.9 kg (187 lb 2.7  oz)  SpO2: 96%  98% 98%    Wt Readings from Last 3 Encounters:  08/23/14 84.9 kg (187 lb 2.7 oz)  08/17/14 85.73 kg (189 lb)  01/04/14 90.629 kg (199 lb 12.8 oz)     Intake/Output Summary (Last 24 hours) at 08/23/14 0904 Last data filed at 08/23/14 0500  Gross per 24 hour  Intake   1434 ml  Output    525 ml  Net    909 ml     Physical Exam  Awake Alert, Oriented X 3, No new F.N deficits, Normal affect [place].AT,PERRAL Supple Neck, + JVD, No cervical lymphadenopathy appriciated.  Symmetrical Chest wall movement, Good air movement bilaterally, +ve rales RRR,No Gallops,Rubs or new Murmurs, No Parasternal Heave +ve B.Sounds, Abd Soft, No tenderness, No organomegaly appriciated, No rebound - guarding or rigidity. No Cyanosis, Clubbing or edema, No new Rash or bruise , Bilat BKA   Data Review   Micro Results No results found for this or any previous visit (from the past 240 hour(s)).  Radiology Reports Dg Chest 1 View  08/20/2014   CLINICAL DATA:  Status post fall 1 day ago.  Bilateral hip pain.  EXAM: CHEST - 1 VIEW  COMPARISON:  08/18/2014  FINDINGS: Mild bilateral interstitial thickening. Moderate right and trace left pleural effusions. Bilateral central vascular prominence. No pneumothorax. Stable cardiomegaly. Prior CABG. No acute osseous abnormality.  IMPRESSION: Overall findings concerning for mild CHF.   Electronically Signed   By: Elige Ko   On: 08/20/2014 20:03   Ct Head Wo Contrast  08/20/2014   CLINICAL DATA:  Status post fall 08/19/2014 with a blow to the head. The patient is anticoagulated.  EXAM: CT HEAD WITHOUT CONTRAST  TECHNIQUE: Contiguous axial images were obtained from the base of the skull through the vertex without intravenous contrast.  COMPARISON:  Head CT scan 07/06/2014.  FINDINGS: The brain is atrophic with chronic microvascular ischemic change. Large, remote right parietal and temporal lobe infarct is identified. There is no evidence of acute  abnormality in infarction, hemorrhage, mass lesion, mass effect, midline shift or abnormal extra-axial fluid collection. No hydrocephalus or pneumocephalus. The calvarium is intact. Imaged paranasal sinuses and mastoid air cells are clear.  IMPRESSION: No acute abnormality.  Atrophy, chronic microvascular ischemic change and remote right temporal and parietal lobe infarct.   Electronically Signed   By: Drusilla Kanner M.D.   On: 08/20/2014 19:42   Dg Hips Bilat With Pelvis 2v  08/20/2014   CLINICAL DATA:  Status post fall.  Left hip pain.  EXAM: DG HIP W/ PELVIS 2V BILAT  COMPARISON:  None.  FINDINGS: There is severe osteopenia. There  is no hip fracture or dislocation. There is a nondisplaced fracture of the left inferior pubic ramus.  There is peripheral vascular atherosclerotic disease.  IMPRESSION: 1. No hip fracture or dislocation. 2. Nondisplaced fracture of the left inferior pubic ramus.   Electronically Signed   By: Elige Ko   On: 08/20/2014 20:05     CBC  Recent Labs Lab 08/20/14 1900  WBC 6.7  HGB 12.4*  HCT 39.6  PLT 125*  MCV 101.5*  MCH 31.8  MCHC 31.3  RDW 13.9  LYMPHSABS 0.6*  MONOABS 0.7  EOSABS 0.1  BASOSABS 0.0    Chemistries   Recent Labs Lab 08/20/14 1900 08/22/14 0105 08/23/14 0735  NA 143 138 138  K 4.2 4.8 4.2  CL 109 100 100  CO2 29 34* 31  GLUCOSE 142* 309* 180*  BUN 37* 37* 47*  CREATININE 1.56* 2.09* 2.17*  CALCIUM 8.5 8.3* 8.2*  AST 25  --   --   ALT 31  --   --   ALKPHOS 57  --   --   BILITOT 1.0  --   --    ------------------------------------------------------------------------------------------------------------------ estimated creatinine clearance is 25.3 mL/min (by C-G formula based on Cr of 2.17). ------------------------------------------------------------------------------------------------------------------  Recent Labs  08/21/14 0446 08/22/14 0830  HGBA1C 8.3* 8.2*    ------------------------------------------------------------------------------------------------------------------  Recent Labs  08/21/14 0441  CHOL 106  HDL 32*  LDLCALC 55  TRIG 94  CHOLHDL 3.3   ------------------------------------------------------------------------------------------------------------------  Recent Labs  08/21/14 0441  TSH 3.771   ------------------------------------------------------------------------------------------------------------------ No results for input(s): VITAMINB12, FOLATE, FERRITIN, TIBC, IRON, RETICCTPCT in the last 72 hours.  Coagulation profile  Recent Labs Lab 08/20/14 1900 08/21/14 0441 08/21/14 1508 08/22/14 0105 08/23/14 0322  INR 1.42 1.35 1.46 1.66* 2.17*    No results for input(s): DDIMER in the last 72 hours.  Cardiac Enzymes  Recent Labs Lab 08/21/14 1508 08/21/14 1825 08/22/14 0105  TROPONINI 0.28* 0.24* 0.25*   ------------------------------------------------------------------------------------------------------------------ Invalid input(s): POCBNP     Time Spent in minutes   35   Montgomery Favor K M.D on 08/23/2014 at 9:04 AM  Between 7am to 7pm - Pager - [phone]  After 7pm go to [email or web address] - password [facility]  [facility] Office  [phone]

## 2014-08-23 NOTE — Progress Notes (Signed)
ANTICOAGULATION CONSULT NOTE Pharmacy Consult for Coumadin Indication: atrial fibrillation  Allergies  Allergen Reactions  . Cephalexin Other (See Comments)    Unknown   . Ciprofloxacin Other (See Comments)    Unknown     Patient Measurements: Height: 6' (182.9 cm) Weight: 187 lb 2.7 oz (84.9 kg) IBW/kg (Calculated) : 77.6  Vital Signs: Temp: 98 F (36.7 C) (02/02 0549) Temp Source: Oral (02/02 0549) BP: 131/68 mmHg (02/02 0549) Pulse Rate: 58 (02/02 0549)  Labs:  Recent Labs  08/20/14 1900  08/21/14 1508 08/21/14 1825 08/22/14 0105 08/23/14 0322 08/23/14 0735  HGB 12.4*  --   --   --   --   --   --   HCT 39.6  --   --   --   --   --   --   PLT 125*  --   --   --   --   --   --   LABPROT 17.5*  < > 17.8*  --  19.8* 24.4*  --   INR 1.42  < > 1.46  --  1.66* 2.17*  --   CREATININE 1.56*  --   --   --  2.09*  --  2.17*  TROPONINI 0.32*  < > 0.28* 0.24* 0.25*  --   --   < > = values in this interval not displayed.  Estimated Creatinine Clearance: 25.3 mL/min (by C-G formula based on Cr of 2.17).   Assessment: 81 yo M presented to [facility] after fall resulting in pubic rami fracture.  Of note, pt was supposed to be on Coumadin PTA for hx of afib.  MD notes document this but family states the patient has not had any doses in the last week, supported by his subtherapeutic INR on admission.  To restart Coumadin.  INR on admission = 1.35 INR today = 2.17 (fast climb -- dose PTA = 3 mg daily)  Goal of Therapy:  INR 2-3 Monitor platelets by anticoagulation protocol: Yes   Plan:  Coumadin 0.5mg  PO x 1 tonight. Daily INR.  Thank you. Okey RegalLisa Coraline Talwar, PharmD [phone]  08/23/2014 10:06 AM

## 2014-08-23 NOTE — Care Management Note (Signed)
    Page 1 of 1   08/26/2014     1:49:26 PM CARE MANAGEMENT NOTE 08/26/2014  Patient:  Karleen DolphinCARTER,Aijalon R   Account Number:  [id number]  Date Initiated:  08/23/2014  Documentation initiated by:  Abbygayle Helfand  Subjective/Objective Assessment:   Pt adm on 08/20/14 with CHF, pelvic fx.  PTA, pt resided at home with spouse.     Action/Plan:   PT recommending SNF at dc; CSW following to facilitate dc to SNF for rehab at dc.   Anticipated DC Date:  08/24/2014   Anticipated DC Plan:  SKILLED NURSING FACILITY  In-house referral  Clinical Social Worker      DC Planning Services  CM consult      Choice offered to / List presented to:             Status of service:  Completed, signed off Medicare Important Message given?  YES (If response is "NO", the following Medicare IM given date fields will be blank) Date Medicare IM given:  08/23/2014 Medicare IM given by:  Kaelynne Christley Date Additional Medicare IM given:   Additional Medicare IM given by:    Discharge Disposition:  SKILLED NURSING FACILITY  Per UR Regulation:  Reviewed for med. necessity/level of care/duration of stay  If discussed at Long Length of Stay Meetings, dates discussed:   08/25/2014    Comments:  08/25/14 Sidney AceJulie Chariti Havel, RN, BSN [phone] Pt discharged to SNF today, per CSW arrangements.

## 2014-08-23 NOTE — Progress Notes (Cosign Needed)
BSW Intern spoke with pt's wife regarding wanting to change SNF preferences. Pt wife would like [facility] as their first choice, and [facility] as their second choice. Wife was reassured after conversation. Active bed search is in place. SW will update bed offers to patient and family this afternoon.

## 2014-08-24 ENCOUNTER — Inpatient Hospital Stay (HOSPITAL_COMMUNITY): Payer: Medicare Other

## 2014-08-24 DIAGNOSIS — E1165 Type 2 diabetes mellitus with hyperglycemia: Secondary | ICD-10-CM

## 2014-08-24 DIAGNOSIS — S329XXS Fracture of unspecified parts of lumbosacral spine and pelvis, sequela: Secondary | ICD-10-CM

## 2014-08-24 LAB — BASIC METABOLIC PANEL
ANION GAP: 3 — AB (ref 5–15)
BUN: 48 mg/dL — ABNORMAL HIGH (ref 6–23)
CALCIUM: 8.3 mg/dL — AB (ref 8.4–10.5)
CO2: 34 mmol/L — ABNORMAL HIGH (ref 19–32)
Chloride: 102 mmol/L (ref 96–112)
Creatinine, Ser: 1.8 mg/dL — ABNORMAL HIGH (ref 0.50–1.35)
GFR calc Af Amer: 37 mL/min — ABNORMAL LOW (ref >90)
GFR calc non Af Amer: 32 mL/min — ABNORMAL LOW (ref >90)
Glucose, Bld: 206 mg/dL — ABNORMAL HIGH (ref 70–99)
POTASSIUM: 4.4 mmol/L (ref 3.5–5.1)
Sodium: 139 mmol/L (ref 135–145)

## 2014-08-24 LAB — GLUCOSE, CAPILLARY
GLUCOSE-CAPILLARY: 140 mg/dL — AB (ref 70–99)
GLUCOSE-CAPILLARY: 186 mg/dL — AB (ref 70–99)
GLUCOSE-CAPILLARY: 205 mg/dL — AB (ref 70–99)
Glucose-Capillary: 264 mg/dL — ABNORMAL HIGH (ref 70–99)
Glucose-Capillary: 359 mg/dL — ABNORMAL HIGH (ref 70–99)

## 2014-08-24 LAB — PROTIME-INR
INR: 2.05 — ABNORMAL HIGH (ref 0.00–1.49)
Prothrombin Time: 23.3 seconds — ABNORMAL HIGH (ref 11.6–15.2)

## 2014-08-24 MED ORDER — ISOSORBIDE MONONITRATE ER 60 MG PO TB24
60.0000 mg | ORAL_TABLET | Freq: Every day | ORAL | Status: DC
Start: 2014-08-24 — End: 2014-08-25
  Administered 2014-08-25: 60 mg via ORAL
  Filled 2014-08-24 (×3): qty 1

## 2014-08-24 MED ORDER — INSULIN GLARGINE 100 UNIT/ML ~~LOC~~ SOLN
17.0000 [IU] | Freq: Every day | SUBCUTANEOUS | Status: DC
  Administered 2014-08-25: 17 [IU] via SUBCUTANEOUS
  Filled 2014-08-24: qty 0.17

## 2014-08-24 MED ORDER — WARFARIN SODIUM 3 MG PO TABS
3.0000 mg | ORAL_TABLET | Freq: Every day | ORAL | Status: DC
  Administered 2014-08-24 – 2014-08-25 (×2): 3 mg via ORAL
  Filled 2014-08-24 (×2): qty 1

## 2014-08-24 MED ORDER — FUROSEMIDE 10 MG/ML IJ SOLN
40.0000 mg | Freq: Once | INTRAMUSCULAR | Status: AC
  Administered 2014-08-24: 40 mg via INTRAVENOUS
  Filled 2014-08-24: qty 4

## 2014-08-24 NOTE — Progress Notes (Signed)
Physical Therapy Treatment Patient Details Name: Tyler Moon MRN: [id number] DOB: 1923/02/22 Today's Date: 08/24/2014    History of Present Illness Pt with bil BKA who fell from w/c during transfer to walker and suffered L pubic rami fracture. Pt also with pneumonia during December.    PT Comments    Pt is planning to go home with wife who is not able to physically assist him per daughter.  His plan is now to go to SNF for strengthening to increase standing tolerance, and the pt now being able to take some sidesteps makes him a stronger candidate.  No equipment needed for now.  Follow Up Recommendations  SNF     Equipment Recommendations  None recommended by PT    Recommendations for Other Services       Precautions / Restrictions Precautions Precautions: Fall Precaution Comments: B BKA Required Braces or Orthoses:  (B prostheses) Restrictions Weight Bearing Restrictions: Yes LLE Weight Bearing: Weight bearing as tolerated Other Position/Activity Restrictions: Pt is limited for time standing with prostheses    Mobility  Bed Mobility Overal bed mobility: Needs Assistance Bed Mobility: Supine to Sit     Supine to sit: Max assist Sit to supine: Max assist   General bed mobility comments: Assist to help legs and trunk support   Transfers Overall transfer level: Needs assistance Equipment used: Rolling walker (2 wheeled);2 person hand held assist Transfers: Sit to/from [employer] Sit to Stand: Max assist;+2 physical assistance;+2 safety/equipment;From elevated surface Stand pivot transfers: Max assist;+2 physical assistance;+2 safety/equipment;From elevated surface       General transfer comment: Pt was able to gain control of knees after repetitive standing at walker, with a better  control x posture still flexed  Ambulation/Gait             General Gait Details: sidesteps with walker only to chair   Stairs            Wheelchair  Mobility    Modified Rankin (Stroke Patients Only)       Balance Overall balance assessment: Needs assistance Sitting-balance support: Bilateral upper extremity supported;Feet supported Sitting balance-Leahy Scale: Poor   Postural control: Right lateral lean;Other (comment) (flexion posture) Standing balance support: Bilateral upper extremity supported;During functional activity Standing balance-Leahy Scale: Poor Standing balance comment: Pt is wanting to hold front of walker but is not able to balance like that, and his condom catheter keeps coming off                    Cognition Arousal/Alertness: Awake/alert Behavior During Therapy: WFL for tasks assessed/performed Overall Cognitive Status: Within Functional Limits for tasks assessed                      Exercises      General Comments General comments (skin integrity, edema, etc.): Pt is in pain with movement but is motivated to get his legsunder him and control standing      Pertinent Vitals/Pain Pain Assessment: 0-10 Pain Score: 8  Pain Location: pelvis Pain Intervention(s): Limited activity within patient's tolerance;Monitored during session;Premedicated before session;Repositioned;Other (comment) (Nursing in to see pt)    Home Living                      Prior Function            PT Goals (current goals can now be found in the care plan section) Acute Rehab PT Goals Patient Stated  Goal: To get able to walk PT Goal Formulation: With family Progress towards PT goals: Progressing toward goals    Frequency  Min 3X/week    PT Plan Current plan remains appropriate    Co-evaluation             End of Session Equipment Utilized During Treatment: Oxygen;Other (comment) (B BK prostheses) Activity Tolerance: Patient tolerated treatment well Patient left: in chair;with call bell/phone within reach;with family/visitor present;with nursing/sitter in room     Time: [id number] PT  Time Calculation (min) (ACUTE ONLY): 40 min  Charges:  $Therapeutic Activity: 23-37 mins $Neuromuscular Re-education: 8-22 mins                    G Codes:      Ivar DrapeStout, [name] 08/24/2014, 11:37 AM  Samul Dadauth [name], PT MS Acute Rehab Dept. Number: [phone]

## 2014-08-24 NOTE — Progress Notes (Signed)
Made MD aware of patient having 7 beat run of V-Tach. MD stated that ok. No new orders and that he will further check on patient. Made the RN aware as well to continue to monitor that patient for further changes in that patient.

## 2014-08-24 NOTE — Progress Notes (Signed)
PROGRESS NOTE  Tyler Moon R Sherburn ZOX:[id number] DOB: 12/26/1922 DOA: 08/20/2014 PCP: Ernestine ConradBLUTH, KIRK, MD  Assessment/Plan: Acute on chronic systolic CHF -Continue furosemide -Patient appears to be near his dry weight -Repeat chest x-ray -Repeat BNP -Restart imdur -08/17/2014 weight--189lbs -08/24/14 weight--184 -Dry weight appears to be 184 pounds -08/21/2014 echo EF 35-40 percent Mechanical fall with left inferior pubic ramus fracture -Discussed with orthopedic physician Dr. Lajoyce Cornersuda. Weightbearing as tolerated. -PT evaluation-->SNF -wife desires [facility] Acute on Chronic Respiratory failure -normally on 2 L at home Paroxysmal atrial fibrillation -Continue Coumadin Complete Heart Block -noted transiently while on sotalol, now resolved by follow-up monitoring off the medication -Transient 2:1 heart block noted by subsequent evaluation. Diabetes mellitus type 2, uncontrolled -08/22/2014 hemoglobin A1c 8.2 -Increase Lantus to 17 units daily -Continue NovoLog sliding scale CKDstage III-4 -Baseline creatinine 1.4-1.8 -Monitor while on furosemide   Family Communication:   Wife updated at beside Disposition Plan:   Home when medically stable       Procedures/Studies: Dg Chest 1 View  08/20/2014   CLINICAL DATA:  Status post fall 1 day ago.  Bilateral hip pain.  EXAM: CHEST - 1 VIEW  COMPARISON:  08/18/2014  FINDINGS: Mild bilateral interstitial thickening. Moderate right and trace left pleural effusions. Bilateral central vascular prominence. No pneumothorax. Stable cardiomegaly. Prior CABG. No acute osseous abnormality.  IMPRESSION: Overall findings concerning for mild CHF.   Electronically Signed   By: [name]  Patel   On: 08/20/2014 20:03   Ct Head Wo Contrast  08/20/2014   CLINICAL DATA:  Status post fall 08/19/2014 with a blow to the head. The patient is anticoagulated.  EXAM: CT HEAD WITHOUT CONTRAST  TECHNIQUE: Contiguous axial images were obtained from the base of the  skull through the vertex without intravenous contrast.  COMPARISON:  Head CT scan 07/06/2014.  FINDINGS: The brain is atrophic with chronic microvascular ischemic change. Large, remote right parietal and temporal lobe infarct is identified. There is no evidence of acute abnormality in infarction, hemorrhage, mass lesion, mass effect, midline shift or abnormal extra-axial fluid collection. No hydrocephalus or pneumocephalus. The calvarium is intact. Imaged paranasal sinuses and mastoid air cells are clear.  IMPRESSION: No acute abnormality.  Atrophy, chronic microvascular ischemic change and remote right temporal and parietal lobe infarct.   Electronically Signed   By: [name]  Dalessio M.D.   On: 08/20/2014 19:42   Dg Hips Bilat With Pelvis 2v  08/20/2014   CLINICAL DATA:  Status post fall.  Left hip pain.  EXAM: DG HIP W/ PELVIS 2V BILAT  COMPARISON:  None.  FINDINGS: There is severe osteopenia. There is no hip fracture or dislocation. There is a nondisplaced fracture of the left inferior pubic ramus.  There is peripheral vascular atherosclerotic disease.  IMPRESSION: 1. No hip fracture or dislocation. 2. Nondisplaced fracture of the left inferior pubic ramus.   Electronically Signed   By: [name]  Patel   On: 08/20/2014 20:05         Subjective: Patient still complains of shortness of breath. It is improved from prior. Denies any nausea, vomiting, diarrhea, abdominal pain, chest pain, fevers, chills, hemoptysis  Objective: Filed Vitals:   08/23/14 2045 08/24/14 0530 08/24/14 1055 08/24/14 1500  BP: 133/61 146/70 168/68 125/80  Pulse: 74 75 88 80  Temp: 97.8 F (36.6 C) 97.6 F (36.4 C) 97.5 F (36.4 C) 98.1 F (36.7 C)  TempSrc: Oral Oral Oral Oral  Resp: 18 18 20  18  Height:      Weight:  83.5 kg (184 lb 1.4 oz)    SpO2: 95% 94% 92% 95%    Intake/Output Summary (Last 24 hours) at 08/24/14 1504 Last data filed at 08/24/14 1500  Gross per 24 hour  Intake    760 ml  Output    550 ml    Net    210 ml   Weight change: -1.4 kg (-3 lb 1.4 oz) Exam:   General:  Pt is alert, follows commands appropriately, not in acute distress  HEENT: No icterus, No thrush, No neck mass, [place]/AT  Cardiovascular: IRRR, S1/S2, no rubs, no gallops  Respiratory: Bilateral rhonchi. Good air movement. No wheeze  Abdomen: Soft/+BS, non tender, non distended, no guarding  Extremities: 1+ LE edema, No lymphangitis, No petechiae, No rashes, no synovitis  Data Reviewed: Basic Metabolic Panel:  Recent Labs Lab 08/20/14 1900 08/22/14 0105 08/23/14 0735 08/24/14 0525  NA 143 138 138 139  K 4.2 4.8 4.2 4.4  CL 109 100 100 102  CO2 29 34* 31 34*  GLUCOSE 142* 309* 180* 206*  BUN 37* 37* 47* 48*  CREATININE 1.56* 2.09* 2.17* 1.80*  CALCIUM 8.5 8.3* 8.2* 8.3*   Liver Function Tests:  Recent Labs Lab 08/20/14 1900  AST 25  ALT 31  ALKPHOS 57  BILITOT 1.0  PROT 5.8*  ALBUMIN 3.1*   No results for input(s): LIPASE, AMYLASE in the last 168 hours. No results for input(s): AMMONIA in the last 168 hours. CBC:  Recent Labs Lab 08/20/14 1900  WBC 6.7  NEUTROABS 5.3  HGB 12.4*  HCT 39.6  MCV 101.5*  PLT 125*   Cardiac Enzymes:  Recent Labs Lab 08/20/14 2145 08/21/14 0446 08/21/14 1508 08/21/14 1825 08/22/14 0105  TROPONINI 0.38* 0.36* 0.28* 0.24* 0.25*   BNP: Invalid input(s): POCBNP CBG:  Recent Labs Lab 08/23/14 1108 08/23/14 1703 08/23/14 2120 08/24/14 0546 08/24/14 1131  GLUCAP 238* 257* 321* 186* 264*    No results found for this or any previous visit (from the past 240 hour(s)).   Scheduled Meds: . aspirin EC  81 mg Oral Daily  . cholecalciferol  1,000 Units Oral Daily  . docusate sodium  100 mg Oral QHS  . famotidine  20 mg Oral Daily  . feeding supplement (NEPRO CARB STEADY)  237 mL Oral BID PC  . furosemide  40 mg Oral Daily  . gabapentin  600 mg Oral BID  . hydrALAZINE  25 mg Oral TID  . insulin aspart  0-5 Units Subcutaneous QHS  .  insulin aspart  0-9 Units Subcutaneous TID WC  . [START ON 08/25/2014] insulin glargine  17 Units Subcutaneous Daily  . simvastatin  40 mg Oral QHS  . sodium chloride  3 mL Intravenous Q12H  . tiZANidine  2 mg Oral BID  . vitamin B-12  1,000 mcg Oral Daily  . vitamin C  500 mg Oral Daily  . warfarin  3 mg Oral q1800  . Warfarin - Pharmacist Dosing Inpatient   Does not apply Q24H   Continuous Infusions:    Menashe Kafer, DO  [facility] Pager [phone]  If 7PM-7AM, please contact night-coverage [email or web address] Password [email or web address] 08/24/2014, 3:04 PM   LOS: 4 days

## 2014-08-24 NOTE — Progress Notes (Signed)
ANTICOAGULATION CONSULT NOTE Pharmacy Consult for Coumadin Indication: atrial fibrillation  Allergies  Allergen Reactions  . Cephalexin Other (See Comments)    Unknown   . Ciprofloxacin Other (See Comments)    Unknown     Patient Measurements: Height: 6' (182.9 cm) Weight: 184 lb 1.4 oz (83.5 kg) IBW/kg (Calculated) : 77.6  Vital Signs: Temp: 97.6 F (36.4 C) (02/03 0530) Temp Source: Oral (02/03 0530) BP: 146/70 mmHg (02/03 0530) Pulse Rate: 75 (02/03 0530)  Labs:  Recent Labs  08/21/14 1508 08/21/14 1825 08/22/14 0105 08/23/14 0322 08/23/14 0735 08/24/14 0525  LABPROT 17.8*  --  19.8* 24.4*  --  23.3*  INR 1.46  --  1.66* 2.17*  --  2.05*  CREATININE  --   --  2.09*  --  2.17* 1.80*  TROPONINI 0.28* 0.24* 0.25*  --   --   --     Estimated Creatinine Clearance: 30.5 mL/min (by C-G formula based on Cr of 1.8).   Assessment: 81 yo M presented to [facility] after fall resulting in pubic rami fracture.  Of note, pt was supposed to be on Coumadin PTA for hx of afib.  MD notes document this but family states the patient has not had any doses in the last week, supported by his subtherapeutic INR on admission.  To restart Coumadin.  INR on admission = 1.35 INR today = 2.05 (dose PTA = 3 mg daily)  Goal of Therapy:  INR 2-3 Monitor platelets by anticoagulation protocol: Yes   Plan:  Coumadin 3 mg po daily at 1800 pm Daily INR.  Thank you. Okey RegalLisa Devyon Keator, PharmD [phone]  08/24/2014 10:17 AM

## 2014-08-24 NOTE — Progress Notes (Signed)
Inpatient Diabetes Program Recommendations  AACE/ADA: New Consensus Statement on Inpatient Glycemic Control (2013)  Target Ranges:  Prepandial:   less than 140 mg/dL      Peak postprandial:   less than 180 mg/dL (1-2 hours)      Critically ill patients:  140 - 180 mg/dL   Results for Tyler Moon, Tyler Moon (MRN [id number]) as of 08/24/2014 10:26  Ref. Range 08/23/2014 06:23 08/23/2014 11:08 08/23/2014 17:03 08/23/2014 21:20 08/24/2014 05:46  Glucose-Capillary Latest Range: 70-99 mg/dL 621165 (H) 308238 (H) 657257 (H) 321 (H) 186 (H)   Diabetes history: DM Outpatient Diabetes medications: Latnus 17 units BID, Novolog 0-20 units TID with meals Current orders for Inpatient glycemic control: Lantus 10 units daily, Novolog 0-9 units TID with meals, Novolog 0-5 units HS  Inpatient Diabetes Program Recommendations Insulin - Basal: Please consider increasing Lantus to 12 units daily. Insulin - Meal Coverage: Post prandial glucose is consistently elevated. Please consider ordering Novolog 5 units TID with meals if patient eats at least 50% of meals (in addition to correction insulin scale).   Thanks, Orlando PennerMarie Orestes Geiman, RN, MSN, CCRN, CDE Diabetes Coordinator Inpatient Diabetes Program [phone] (Team Pager) [phone] ([facility] office) [phone] [facility] office)

## 2014-08-24 NOTE — Progress Notes (Signed)
Dr. paged about patient 7 beat run of V-tach. No response to page, patient had no other cardiac arrhythmias. No signs or symptoms of distress, and no verbal complaints, will continue to monitor for further changes.

## 2014-08-24 NOTE — Progress Notes (Addendum)
Pt had a 7 beat run of V-tach. MD paged several times prior to ending my shift. Reported off to receiving RN. RN paged MD as well. Patient was sleeping when this occurred. Receiving RN will continue to monitor patient for further changes in condition.

## 2014-08-25 ENCOUNTER — Telehealth: Payer: Self-pay | Admitting: Cardiology

## 2014-08-25 DIAGNOSIS — I453 Trifascicular block: Secondary | ICD-10-CM

## 2014-08-25 DIAGNOSIS — I255 Ischemic cardiomyopathy: Secondary | ICD-10-CM

## 2014-08-25 DIAGNOSIS — I10 Essential (primary) hypertension: Secondary | ICD-10-CM

## 2014-08-25 DIAGNOSIS — I5023 Acute on chronic systolic (congestive) heart failure: Secondary | ICD-10-CM

## 2014-08-25 LAB — GLUCOSE, CAPILLARY
GLUCOSE-CAPILLARY: 322 mg/dL — AB (ref 70–99)
Glucose-Capillary: 193 mg/dL — ABNORMAL HIGH (ref 70–99)
Glucose-Capillary: 282 mg/dL — ABNORMAL HIGH (ref 70–99)

## 2014-08-25 LAB — BASIC METABOLIC PANEL
Anion gap: 5 (ref 5–15)
BUN: 40 mg/dL — AB (ref 6–23)
CHLORIDE: 101 mmol/L (ref 96–112)
CO2: 33 mmol/L — ABNORMAL HIGH (ref 19–32)
Calcium: 8.5 mg/dL (ref 8.4–10.5)
Creatinine, Ser: 1.58 mg/dL — ABNORMAL HIGH (ref 0.50–1.35)
GFR calc Af Amer: 43 mL/min — ABNORMAL LOW (ref >90)
GFR calc non Af Amer: 37 mL/min — ABNORMAL LOW (ref >90)
Glucose, Bld: 188 mg/dL — ABNORMAL HIGH (ref 70–99)
Potassium: 4.4 mmol/L (ref 3.5–5.1)
Sodium: 139 mmol/L (ref 135–145)

## 2014-08-25 LAB — PROTIME-INR
INR: 1.74 — ABNORMAL HIGH (ref 0.00–1.49)
Prothrombin Time: 20.5 seconds — ABNORMAL HIGH (ref 11.6–15.2)

## 2014-08-25 LAB — BRAIN NATRIURETIC PEPTIDE: B Natriuretic Peptide: 823.1 pg/mL — ABNORMAL HIGH (ref 0.0–100.0)

## 2014-08-25 LAB — MAGNESIUM: MAGNESIUM: 2.2 mg/dL (ref 1.5–2.5)

## 2014-08-25 MED ORDER — FUROSEMIDE 10 MG/ML IJ SOLN
40.0000 mg | Freq: Every day | INTRAMUSCULAR | Status: DC
  Administered 2014-08-25: 40 mg via INTRAVENOUS
  Filled 2014-08-25: qty 4

## 2014-08-25 MED ORDER — HYDROCODONE-ACETAMINOPHEN 5-325 MG PO TABS: 1.0000 | ORAL_TABLET | ORAL | Status: DC | PRN

## 2014-08-25 MED ORDER — FUROSEMIDE 40 MG PO TABS: 40.0000 mg | ORAL_TABLET | Freq: Every day | ORAL | Status: AC

## 2014-08-25 MED ORDER — NEPRO/CARBSTEADY PO LIQD: 237.0000 mL | Freq: Two times a day (BID) | ORAL | Status: AC

## 2014-08-25 MED ORDER — FUROSEMIDE 40 MG PO TABS: 40.0000 mg | ORAL_TABLET | Freq: Every day | ORAL | Status: DC

## 2014-08-25 NOTE — Consult Note (Addendum)
CARDIOLOGY CONSULT NOTE     Primary Care Physician: [name], MD Referring Physician:  Dr Marguerita Merles Date: 08/20/2014  Reason for consultation:  CHF, LBBB, afib  Tyler Moon is a 81 y.o. male with a h/o CAD, chronic systolic dysfunction, Afib, and bilateral BKA who presents after a mechanical fall.  He was transferring from chair to walker and lost his balance and fell backwards.  He has a pubic ramus fracture and was therefore admitted to [facility].  He initially had rate controlled AF which spontaneously converted to sinus rhythm.  He presented with bilateral edema which has since improved.  Though he has a very long first degree AV block and LBBB, he has had no recent syncope or symptoms of AV block.  He has had no arrhythmias documented here on telemetry. The patient is elderly and chronically ill.  He is frail.  He will be going to rehab upon discharge.  Today, he denies symptoms of palpitations, chest pain, shortness of breath, orthopnea, PND,  dizziness, presyncope, syncope, or neurologic sequela. The patient is tolerating medications without difficulties and is otherwise without complaint today.   Past Medical History  Diagnosis Date  . Atrial fibrillation   . Coronary atherosclerosis of native coronary artery     Multivessel  . TIA (transient ischemic attack)   . Anemia   . GI bleed   . Diabetes type 2, controlled   . Hyperlipidemia   . Peripheral arterial disease   . Essential hypertension, benign   . Partial deafness   . MI (myocardial infarction)   . S/P bilateral BKA (below knee amputation)   . Complete heart block     On sotalol  . First degree heart block   . Mitral regurgitation     mild-moderate  . Arthritis   . Neuromuscular disorder     neuropathy   Past Surgical History  Procedure Laterality Date  . Coronary artery bypass graft  1994  . Transurethral resection of prostate    . Below knee leg amputation      Bilateral  . Cataract extraction       . aspirin EC  81 mg Oral Daily  . cholecalciferol  1,000 Units Oral Daily  . docusate sodium  100 mg Oral QHS  . famotidine  20 mg Oral Daily  . feeding supplement (NEPRO CARB STEADY)  237 mL Oral BID PC  . furosemide  40 mg Intravenous Daily  . gabapentin  600 mg Oral BID  . hydrALAZINE  25 mg Oral TID  . insulin aspart  0-5 Units Subcutaneous QHS  . insulin aspart  0-9 Units Subcutaneous TID WC  . insulin glargine  17 Units Subcutaneous Daily  . isosorbide mononitrate  60 mg Oral Daily  . simvastatin  40 mg Oral QHS  . sodium chloride  3 mL Intravenous Q12H  . tiZANidine  2 mg Oral BID  . vitamin B-12  1,000 mcg Oral Daily  . vitamin C  500 mg Oral Daily  . warfarin  3 mg Oral q1800  . Warfarin - Pharmacist Dosing Inpatient   Does not apply Q24H      Allergies  Allergen Reactions  . Cephalexin Other (See Comments)    Unknown   . Ciprofloxacin Other (See Comments)    Unknown     History   Social History  . Marital Status: Married    Spouse Name: N/A    Number of Children: N/A  . Years of  Education: N/A   Occupational History  . Not on file.   Social History Main Topics  . Smoking status: Former Smoker    Types: Cigarettes  . Smokeless tobacco: Never Used     Comment: Quit 40 years ago  . Alcohol Use: No  . Drug Use: No  . Sexual Activity: Not on file   Other Topics Concern  . Not on file   Social History Narrative    Family History  Problem Relation Age of Onset  . Heart disease Mother   . Stroke Father     ROS- pt unable to perform full ROS due to poor hearing  Physical Exam: Telemetry: Filed Vitals:   08/24/14 1923 08/25/14 0000 08/25/14 0511 08/25/14 0900  BP: 143/73 136/67 132/84 125/73  Pulse: 82 68 94 87  Temp: 98.4 F (36.9 C) 97.6 F (36.4 C) 97.3 F (36.3 C) 97.7 F (36.5 C)  TempSrc: Oral Axillary Oral Oral  Resp: Height:      Weight:   171 lb 8.3 oz (77.8 kg)   SpO2: 94% 99% 96% 92%    GEN- The patient is  elderly and frail appearing, sleeping but rouses Head- normocephalic, atraumatic Eyes-  Sclera clear, conjunctiva pink Ears- hearing is very poor Oropharynx- clear Neck- supple  Lungs- Clear to ausculation bilaterally, normal work of breathing Heart- Regular rate and rhythm  GI- soft, NT, ND, + BS Extremities- no clubbing, cyanosis, s/p bilateral BKA, + dependant edema MS- s/p bilateral BKA,  Skin- no rash or lesion Psych- euthymic mood, full affect Neuro- strength and sensation are intact  EKG reveals sinus rhythm with long first degree AV block and LBBB  Echo is reviewed, EF 35-40%  Recent event monitor is reviewed and reveals sinus rhythm with very long first degree AV block, second degree AV block is also noted (nocturnal)  Labs:   Lab Results  Component Value Date   WBC 6.7 08/20/2014   HGB 12.4* 08/20/2014   HCT 39.6 08/20/2014   MCV 101.5* 08/20/2014   PLT 125* 08/20/2014    Recent Labs Lab 08/20/14 1900  08/25/14 0420  NA 143  < > 139  K 4.2  < > 4.4  CL 109  < > 101  CO2 29  < > 33*  BUN 37*  < > 40*  CREATININE 1.56*  < > 1.58*  CALCIUM 8.5  < > 8.5  PROT 5.8*  --   --   BILITOT 1.0  --   --   ALKPHOS 57  --   --   ALT 31  --   --   AST 25  --   --   GLUCOSE 142*  < > 188*  < > = values in this interval not displayed. Lab Results  Component Value Date   TROPONINI 0.25* 08/22/2014    Lab Results  Component Value Date   CHOL 106 08/21/2014   Lab Results  Component Value Date   HDL 32* 08/21/2014   Lab Results  Component Value Date   LDLCALC 55 08/21/2014   Lab Results  Component Value Date   TRIG 94 08/21/2014   Lab Results  Component Value Date   CHOLHDL 3.3 08/21/2014   No results found for: LDLDIRECT     ASSESSMENT AND PLAN:   1. Acute on chronic systolic dysfunction/ ischemic CM Clinically appears euvolemic at this time Would convert lasix to  po daily Continue hydralazine and imdur Not a candidate  for ace inhibitor due  to renal failure Not a candidate for beta blocker due to bradycardia/ AV block 2 gram sodium restriction  2. Trifascicular block No symptoms presently Avoid AV nodal agents I had a long discussion with the patient, his son, and also his wife.  They understand that he has a degenerative conduction system and is at risk for AV block based on their prior discussion last week with Dr Diona BrownerMcDowell.  They are all in agreement that a conservative approach is preferred.  They are very clear that they are not interested in PPM implant at this time. I have spoken with Dr Diona BrownerMcDowell this am.  Given the absence of complete heart block on our monitor here and the absence of clinical symptoms in this very elderly and frail man, we agree that avoidance of procedures is preferred if at all possible. If the patient develops symptomatic AV block in the future, then PPM may be required at that time.  3. Afib chads2vasc score is at least 8.  He is appropriately anticoagulated with coumadin.  Given his advanced age and h/o GI bleeding/ anemia, I will stop ASA at this time.  Continue coumadion He did have transient afib earlier this admission but has since been in sinus.  He was rate controlled during afib.  Avoid AV nodal agents.  I would also avoid AADs presently.  4. HTN Stable No change required today  5. Elevated tn He had an elevated troponin early during his admission.  This is likely demand ischemic and unlikely an acute coronary syndrome.  Given his advanced age, would recommend a conservative approach. Stop ASA as above Avoid beta blockers as above  No further in patient CV workup planned I think that ultimately, a palliative approach is indicated.  Continue current therapies as outlined above for now. He will follow-up with Dr Diona BrownerMcDowell in the office in 4 weeks  The patient is very ill chronically and is at risk for decompensation/ worsening state.  A high level of decision making was required for this  visit today.  I have discussed his care with both Drs Diona BrownerMcDowell and also Dr Tat. Cardiology team to see as needed while here. Please call with questions.   Hillis RangeJames Clyde Upshaw, MD 08/25/2014  1:34 PM

## 2014-08-25 NOTE — Progress Notes (Signed)
ANTICOAGULATION CONSULT NOTE Pharmacy Consult for Coumadin Indication: atrial fibrillation  Allergies  Allergen Reactions  . Cephalexin Other (See Comments)    Unknown   . Ciprofloxacin Other (See Comments)    Unknown     Patient Measurements: Height: 6' (182.9 cm) Weight: 171 lb 8.3 oz (77.8 kg) IBW/kg (Calculated) : 77.6  Vital Signs: Temp: 97.7 F (36.5 C) (02/04 0900) Temp Source: Oral (02/04 0900) BP: 125/73 mmHg (02/04 0900) Pulse Rate: 87 (02/04 0900)  Labs:  Recent Labs  08/23/14 0322 08/23/14 0735 08/24/14 0525 08/25/14 0420  LABPROT 24.4*  --  23.3* 20.5*  INR 2.17*  --  2.05* 1.74*  CREATININE  --  2.17* 1.80* 1.58*    Estimated Creatinine Clearance: 34.8 mL/min (by C-G formula based on Cr of 1.58).   Assessment: 81 yo M presented to [facility] ED after fall resulting in pubic rami fracture.  Of note, pt was supposed to be on Coumadin PTA for hx of afib.  MD notes document this but family states the patient has not had any doses in the last week, supported by his subtherapeutic INR on admission.  To restart Coumadin.  INR on admission = 1.35 INR today = 1.74 (dose PTA = 3 mg daily), drop due to 0.5 mg dose yesterday  Goal of Therapy:  INR 2-3 Monitor platelets by anticoagulation protocol: Yes   Plan:  Coumadin 3 mg po daily at 1800 pm Daily INR.  Thank you. Okey RegalLisa Rylynne Schicker, PharmD [phone]  08/25/2014 10:11 AM

## 2014-08-25 NOTE — Progress Notes (Signed)
Report called to [facility], Charity fundraiserN at [facility] [place]

## 2014-08-25 NOTE — Discharge Summary (Signed)
Physician Discharge Summary  Tyler Moon ZOX:[id number] DOB: 1923-01-31 DOA: 08/20/2014  PCP: [name], MD  Admit date: 08/20/2014 Discharge date: 08/25/2014  Recommendations for Outpatient Follow-up:  1. Pt will need to follow up with PCP in 2 weeks post discharge 2. Please obtain BMP 08/29/14 3. Please check INR on 08/29/14 and adjust coumadin accordinly for INR 2-3 4. Please maintain the patient on 3 L nasal cannula and weaned for oxygenation greater than 92%--patient is normally on 2 L nasal cannula at baseline    Discharge Diagnoses:  Acute on chronic systolic CHF -Patient appears to be near his dry weight -Repeat chest x-ray--worsen pulmonary edema-->restarted IV lasix with good clinical response -Repeat BNP--823 -Restart imdur -08/17/2014 weight--189lbs -08/24/14 weight--184 -08/25/14 weight 171 lbs--?accuracy -Dry weight appears to be 184 pounds -08/21/2014 echo EF 35-40 percent -Restart intravenous furosemide -Consult cardiology in the setting of previous heart block and Afib--?contribution to his CHF -Patient's case was discussed with cardiology, Dr. Johney Frame on the day of discharge. He did not feel that the patient needed any further intervention at this time. He cleared the patient for discharge from a cardiology standpoint. -Discharge with furosemide 40 mg po daily Mechanical fall with left inferior pubic ramus fracture -Discussed with orthopedic physician Dr. Lajoyce Corners. Weightbearing as tolerated. -PT evaluation-->SNF -wife desires [facility] Acute on Chronic Respiratory failure -normally on 2 L at home -presently stable on 3L Paroxysmal atrial fibrillation -Continue Coumadin -rate controlled -He did have transient afib earlier this admission but has since been in sinus. He was rate controlled during afib. Avoid AV nodal agents due to heart block -Given his advanced age and h/o GI bleeding/ anemia, stop ASA at this time. -Continue coumadin Complete Heart Block -noted  transiently while on sotalol, now resolved by follow-up monitoring off the medication -Transient 2:1 heart block noted by subsequent evaluation. -Given the absence of complete heart block on our monitor here and the absence of clinical symptoms in this very elderly and frail man, Cards agrees that avoidance of procedures is preferred if at all possible. If the patient develops symptomatic AV block in the future, then PPM may be required at that time. Diabetes mellitus type 2, uncontrolled -08/22/2014 hemoglobin A1c 8.2 -Increase Lantus to 17 units daily-->d/c with usual home dose insulin as po intake is improving -Continue NovoLog sliding scale CKDstage III-4 -Baseline creatinine 1.4-1.8 -Monitor while on furosemide -Serum creatinine 1.58 on the day discharge  Elevated Troponin -likely demand ischemia in setting of CHF decompensation  Discharge Condition: stable  Disposition: SNF  Diet:heart healthy Wt Readings from Last 3 Encounters:  08/25/14 77.8 kg (171 lb 8.3 oz)  08/17/14 85.73 kg (189 lb)  01/04/14 90.629 kg (199 lb 12.8 oz)    History of present illness:  81 y.o. male with a h/o CAD, chronic systolic dysfunction, Afib, and bilateral BKA who presents after a mechanical fall. He was transferring from chair to walker and lost his balance and fell backwards. He has a pubic ramus fracture and was therefore admitted to [facility]. He initially had rate controlled AF which spontaneously converted to sinus rhythm.The patient was found to have pulmonary edema on chest x-ray. He was started on intravenous furosemide. Unfortunately he had significant elevation in his serum creatinine. His Lasix was held. Unfortunately, he continued to have shortness of breath and pulmonary edema. His IV furosemide was restarted. His renal function remained stable. Cardiology was consulted. They did not feel that the patient needed any further interventions at this time and cleared  the patient for  discharge. He presented with bilateral edema which has since improved. Though he has a very long first degree AV block and LBBB, he has had no recent syncope or symptoms of AV block. He has had no arrhythmias documented here on telemetry. The patient is elderly and chronically ill. He is frail. He will be going to rehab upon discharge.    Consultants: Cardiology  Discharge Exam: Filed Vitals:   08/25/14 1300  BP: 105/53  Pulse: 82  Temp: 97.1 F (36.2 C)  Resp: 18   Filed Vitals:   08/25/14 0000 08/25/14 0511 08/25/14 0900 08/25/14 1300  BP: 136/67 132/84 125/73 105/53  Pulse: 68 94 87 82  Temp: 97.6 F (36.4 C) 97.3 F (36.3 C) 97.7 F (36.5 C) 97.1 F (36.2 C)  TempSrc: Axillary Oral Oral Oral  Resp: Height:      Weight:  77.8 kg (171 lb 8.3 oz)    SpO2: 99% 96% 92% 94%   General: A&O x 3, NAD, pleasant, cooperative Cardiovascular: RRR, no rub, no gallop, no S3 Respiratory: bilateral scattered rales, no wheeze  Abdomen:soft, nontender, nondistended, positive bowel sounds Extremities: trace edema, No lymphangitis, no petechiae  Discharge Instructions      Discharge Instructions    Diet - low sodium heart healthy    Complete by:  As directed      Increase activity slowly    Complete by:  As directed             Medication List    STOP taking these medications        aspirin EC 81 MG tablet     enalapril 20 MG tablet  Commonly known as:  VASOTEC      TAKE these medications        cholecalciferol 1000 UNITS tablet  Commonly known as:  VITAMIN D  Take 1,000 Units by mouth daily.     docusate sodium 100 MG capsule  Commonly known as:  COLACE  Take 100 mg by mouth at bedtime.     feeding supplement (NEPRO CARB STEADY) Liqd  Take 237 mLs by mouth 2 (two) times daily after a meal.     furosemide 40 MG tablet  Commonly known as:  LASIX  Take 1 tablet (40 mg total) by mouth daily.  Start taking on:  08/26/2014     gabapentin 600 MG  tablet  Commonly known as:  NEURONTIN  Take 600 mg by mouth 2 (two) times daily. And 2 at bedtime     hydrALAZINE 25 MG tablet  Commonly known as:  APRESOLINE  Take 1 tablet (25 mg total) by mouth 3 (three) times daily. New medication for high blood pressure treatment.     HYDROcodone-acetaminophen 5-325 MG per tablet  Commonly known as:  NORCO/VICODIN  Take 1 tablet by mouth every 4 (four) hours as needed for moderate pain.     insulin aspart 100 UNIT/ML injection  Commonly known as:  novoLOG  - Sliding-scale NovoLog 3 times daily:  - For blood sugar 70 to 130-no insulin. Blood sugar 131-150-give 3 units insulin. Blood sugar 151-200-give 4 units. Blood sugar 201-250-give 6 units. Blood sugar 231-300-give 9 units. Blood sugar 301-350-give 12 units. Blood sugar 351-400-give 15 units. Blood sugar greater than 400 give 20 units and call your Dr.     insulin glargine 100 UNIT/ML injection  Commonly known as:  LANTUS  Inject 0.17 mLs (17 Units total) into the skin  2 (two) times daily.     isosorbide mononitrate 60 MG 24 hr tablet  Commonly known as:  IMDUR  Take 60 mg by mouth daily.     nitroGLYCERIN 0.4 MG SL tablet  Commonly known as:  NITROSTAT  Place 0.4 mg under the tongue every 5 (five) minutes as needed for chest pain.     ranitidine 150 MG tablet  Commonly known as:  ZANTAC  Take 150 mg by mouth 2 (two) times daily.     simvastatin 40 MG tablet  Commonly known as:  ZOCOR  Take 40 mg by mouth at bedtime.     tiZANidine 4 MG capsule  Commonly known as:  ZANAFLEX  Take 2 mg by mouth 2 (two) times daily.     vitamin B-12 1000 MCG tablet  Commonly known as:  CYANOCOBALAMIN  Take 1,000 mcg by mouth daily.     vitamin C 500 MG tablet  Commonly known as:  ASCORBIC ACID  Take 500 mg by mouth daily.     warfarin 5 MG tablet  Commonly known as:  COUMADIN  Take by mouth.         The results of significant diagnostics from this hospitalization (including imaging,  microbiology, ancillary and laboratory) are listed below for reference.    Significant Diagnostic Studies: Dg Chest 1 View  08/20/2014   CLINICAL DATA:  Status post fall 1 day ago.  Bilateral hip pain.  EXAM: CHEST - 1 VIEW  COMPARISON:  08/18/2014  FINDINGS: Mild bilateral interstitial thickening. Moderate right and trace left pleural effusions. Bilateral central vascular prominence. No pneumothorax. Stable cardiomegaly. Prior CABG. No acute osseous abnormality.  IMPRESSION: Overall findings concerning for mild CHF.   Electronically Signed   By: [name]  Patel   On: 08/20/2014 20:03   Ct Head Wo Contrast  08/20/2014   CLINICAL DATA:  Status post fall 08/19/2014 with a blow to the head. The patient is anticoagulated.  EXAM: CT HEAD WITHOUT CONTRAST  TECHNIQUE: Contiguous axial images were obtained from the base of the skull through the vertex without intravenous contrast.  COMPARISON:  Head CT scan 07/06/2014.  FINDINGS: The brain is atrophic with chronic microvascular ischemic change. Large, remote right parietal and temporal lobe infarct is identified. There is no evidence of acute abnormality in infarction, hemorrhage, mass lesion, mass effect, midline shift or abnormal extra-axial fluid collection. No hydrocephalus or pneumocephalus. The calvarium is intact. Imaged paranasal sinuses and mastoid air cells are clear.  IMPRESSION: No acute abnormality.  Atrophy, chronic microvascular ischemic change and remote right temporal and parietal lobe infarct.   Electronically Signed   By: [name]  Dalessio M.D.   On: 08/20/2014 19:42   Dg Chest Port 1 View  08/24/2014   CLINICAL DATA:  Pulmonary edema.  Coughing congestion.  Recent fall.  EXAM: PORTABLE CHEST - 1 VIEW  COMPARISON:  08/20/2014  FINDINGS: Sequelae of prior CABG are again identified. Cardiac silhouette is partially obscured but appears mildly enlarged. There is persistent left basilar opacity likely reflecting a combination left hemidiaphragm elevation,  small left pleural effusion, and atelectasis. There is a small right pleural effusion, increased from prior. Diffuse interstitial lung opacities have increased from the prior study, with patchy airspace opacities in both lung bases. No pneumothorax is identified. No acute osseous abnormality is identified.  IMPRESSION: Findings consistent with worsening pulmonary edema and small bilateral pleural effusions.   Electronically Signed   By: Sebastian AcheAllen  Grady   On: 08/24/2014 16:24   Dg  Hips Bilat With Pelvis 2v  08/20/2014   CLINICAL DATA:  Status post fall.  Left hip pain.  EXAM: DG HIP W/ PELVIS 2V BILAT  COMPARISON:  None.  FINDINGS: There is severe osteopenia. There is no hip fracture or dislocation. There is a nondisplaced fracture of the left inferior pubic ramus.  There is peripheral vascular atherosclerotic disease.  IMPRESSION: 1. No hip fracture or dislocation. 2. Nondisplaced fracture of the left inferior pubic ramus.   Electronically Signed   By: Elige Ko   On: 08/20/2014 20:05     Microbiology: No results found for this or any previous visit (from the past 240 hour(s)).   Labs: Basic Metabolic Panel:  Recent Labs Lab 08/20/14 1900 08/22/14 0105 08/23/14 0735 08/24/14 0525 08/25/14 0420  NA 143 138 138 139 139  K 4.2 4.8 4.2 4.4 4.4  CL 109 100 100 102 101  CO2 29 34* 31 34* 33*  GLUCOSE 142* 309* 180* 206* 188*  BUN 37* 37* 47* 48* 40*  CREATININE 1.56* 2.09* 2.17* 1.80* 1.58*  CALCIUM 8.5 8.3* 8.2* 8.3* 8.5  MG  --   --   --   --  2.2   Liver Function Tests:  Recent Labs Lab 08/20/14 1900  AST 25  ALT 31  ALKPHOS 57  BILITOT 1.0  PROT 5.8*  ALBUMIN 3.1*   No results for input(s): LIPASE, AMYLASE in the last 168 hours. No results for input(s): AMMONIA in the last 168 hours. CBC:  Recent Labs Lab 08/20/14 1900  WBC 6.7  NEUTROABS 5.3  HGB 12.4*  HCT 39.6  MCV 101.5*  PLT 125*   Cardiac Enzymes:  Recent Labs Lab 08/20/14 2145 08/21/14 0446  08/21/14 1508 08/21/14 1825 08/22/14 0105  TROPONINI 0.38* 0.36* 0.28* 0.24* 0.25*   BNP: Invalid input(s): POCBNP CBG:  Recent Labs Lab 08/24/14 1652 08/24/14 2104 08/25/14 0002 08/25/14 0644 08/25/14 1107  GLUCAP 359* 205* 140* 193* 322*    Time coordinating discharge:  Greater than 30 minutes  Signed:  Berlin Viereck, DO [facility] Pager: [phone] 08/25/2014, 3:17 PM

## 2014-08-25 NOTE — Progress Notes (Signed)
PROGRESS NOTE  Karleen DolphinHoward R Fruge JYN:[id number] DOB: 12/04/1922 DOA: 08/20/2014 PCP: Ernestine ConradBLUTH, KIRK, MD  Assessment/Plan: Acute on chronic systolic CHF -Patient appears to be near his dry weight -Repeat chest x-ray--worsen pulmonary edema -Repeat BNP--823 -Restart imdur -08/17/2014 weight--189lbs -08/24/14 weight--184 -08/25/14 weight 171 lbs--?accuracy -Dry weight appears to be 184 pounds -08/21/2014 echo EF 35-40 percent -Restart intravenous furosemide -Consult cardiology in the setting of previous heart block and Afib--?contribution to his CHF -repeat CXR in am 2/5 Mechanical fall with left inferior pubic ramus fracture -Discussed with orthopedic physician Dr. Lajoyce Cornersuda. Weightbearing as tolerated. -PT evaluation-->SNF -wife desires [facility] Acute on Chronic Respiratory failure -normally on 2 L at home -presently stable on 3L Paroxysmal atrial fibrillation -Continue Coumadin -rate controlled Complete Heart Block -noted transiently while on sotalol, now resolved by follow-up monitoring off the medication -Transient 2:1 heart block noted by subsequent evaluation. Diabetes mellitus type 2, uncontrolled -08/22/2014 hemoglobin A1c 8.2 -Increase Lantus to 17 units daily -Continue NovoLog sliding scale CKDstage III-4 -Baseline creatinine 1.4-1.8 -Monitor while on furosemide Elevated Troponin -likely demand ischemia in setting of CHF decompensation  Family Communication: Wife updated at beside Disposition Plan: [facility] when medically stable       Procedures/Studies: Dg Chest 1 View  08/20/2014   CLINICAL DATA:  Status post fall 1 day ago.  Bilateral hip pain.  EXAM: CHEST - 1 VIEW  COMPARISON:  08/18/2014  FINDINGS: Mild bilateral interstitial thickening. Moderate right and trace left pleural effusions. Bilateral central vascular prominence. No pneumothorax. Stable cardiomegaly. Prior CABG. No acute osseous abnormality.  IMPRESSION: Overall findings concerning for mild CHF.    Electronically Signed   By: [name]  Patel   On: 08/20/2014 20:03   Ct Head Wo Contrast  08/20/2014   CLINICAL DATA:  Status post fall 08/19/2014 with a blow to the head. The patient is anticoagulated.  EXAM: CT HEAD WITHOUT CONTRAST  TECHNIQUE: Contiguous axial images were obtained from the base of the skull through the vertex without intravenous contrast.  COMPARISON:  Head CT scan 07/06/2014.  FINDINGS: The brain is atrophic with chronic microvascular ischemic change. Large, remote right parietal and temporal lobe infarct is identified. There is no evidence of acute abnormality in infarction, hemorrhage, mass lesion, mass effect, midline shift or abnormal extra-axial fluid collection. No hydrocephalus or pneumocephalus. The calvarium is intact. Imaged paranasal sinuses and mastoid air cells are clear.  IMPRESSION: No acute abnormality.  Atrophy, chronic microvascular ischemic change and remote right temporal and parietal lobe infarct.   Electronically Signed   By: [name]  Dalessio M.D.   On: 08/20/2014 19:42   Dg Chest Port 1 View  08/24/2014   CLINICAL DATA:  Pulmonary edema.  Coughing congestion.  Recent fall.  EXAM: PORTABLE CHEST - 1 VIEW  COMPARISON:  08/20/2014  FINDINGS: Sequelae of prior CABG are again identified. Cardiac silhouette is partially obscured but appears mildly enlarged. There is persistent left basilar opacity likely reflecting a combination left hemidiaphragm elevation, small left pleural effusion, and atelectasis. There is a small right pleural effusion, increased from prior. Diffuse interstitial lung opacities have increased from the prior study, with patchy airspace opacities in both lung bases. No pneumothorax is identified. No acute osseous abnormality is identified.  IMPRESSION: Findings consistent with worsening pulmonary edema and small bilateral pleural effusions.   Electronically Signed   By: Sebastian AcheAllen  Grady   On: 08/24/2014 16:24   Dg Hips Bilat With Pelvis 2v  08/20/2014  CLINICAL DATA:  Status post fall.  Left hip pain.  EXAM: DG HIP W/ PELVIS 2V BILAT  COMPARISON:  None.  FINDINGS: There is severe osteopenia. There is no hip fracture or dislocation. There is a nondisplaced fracture of the left inferior pubic ramus.  There is peripheral vascular atherosclerotic disease.  IMPRESSION: 1. No hip fracture or dislocation. 2. Nondisplaced fracture of the left inferior pubic ramus.   Electronically Signed   By: Elige Ko   On: 08/20/2014 20:05        Subjective: Patient continues to complain of shortness of breath although improved. Denies any fevers, chills, chest pain, coughing, hemoptysis, nausea, vomiting, diarrhea, abd pain  Objective: Filed Vitals:   08/24/14 1923 08/25/14 0000 08/25/14 0511 08/25/14 0900  BP: 143/73 136/67 132/84 125/73  Pulse: 82 68 94 87  Temp: 98.4 F (36.9 C) 97.6 F (36.4 C) 97.3 F (36.3 C) 97.7 F (36.5 C)  TempSrc: Oral Axillary Oral Oral  Resp: Height:      Weight:   77.8 kg (171 lb 8.3 oz)   SpO2: 94% 99% 96% 92%    Intake/Output Summary (Last 24 hours) at 08/25/14 1059 Last data filed at 08/25/14 1012  Gross per 24 hour  Intake    740 ml  Output   1500 ml  Net   -760 ml   Weight change: -5.7 kg (-12 lb 9.1 oz) Exam:   General:  Pt is alert, follows commands appropriately, not in acute distress  HEENT: No icterus, No thrush, [place]/AT  Cardiovascular: IRRR, S1/S2, no rubs, no gallops  Respiratory: Bilateral scattered rhonchi. No wheezing.  Abdomen: Soft/+BS, non tender, non distended, no guarding  Extremities: trace LE edema, No lymphangitis, No petechiae, No rashes, no synovitis  Data Reviewed: Basic Metabolic Panel:  Recent Labs Lab 08/20/14 1900 08/22/14 0105 08/23/14 0735 08/24/14 0525 08/25/14 0420  NA 143 138 138 139 139  K 4.2 4.8 4.2 4.4 4.4  CL 109 100 100 102 101  CO2 29 34* 31 34* 33*  GLUCOSE 142* 309* 180* 206* 188*  BUN 37* 37* 47* 48* 40*  CREATININE 1.56* 2.09*  2.17* 1.80* 1.58*  CALCIUM 8.5 8.3* 8.2* 8.3* 8.5  MG  --   --   --   --  2.2   Liver Function Tests:  Recent Labs Lab 08/20/14 1900  AST 25  ALT 31  ALKPHOS 57  BILITOT 1.0  PROT 5.8*  ALBUMIN 3.1*   No results for input(s): LIPASE, AMYLASE in the last 168 hours. No results for input(s): AMMONIA in the last 168 hours. CBC:  Recent Labs Lab 08/20/14 1900  WBC 6.7  NEUTROABS 5.3  HGB 12.4*  HCT 39.6  MCV 101.5*  PLT 125*   Cardiac Enzymes:  Recent Labs Lab 08/20/14 2145 08/21/14 0446 08/21/14 1508 08/21/14 1825 08/22/14 0105  TROPONINI 0.38* 0.36* 0.28* 0.24* 0.25*   BNP: Invalid input(s): POCBNP CBG:  Recent Labs Lab 08/24/14 1131 08/24/14 1652 08/24/14 2104 08/25/14 0002 08/25/14 0644  GLUCAP 264* 359* 205* 140* 193*    No results found for this or any previous visit (from the past 240 hour(s)).   Scheduled Meds: . aspirin EC  81 mg Oral Daily  . cholecalciferol  1,000 Units Oral Daily  . docusate sodium  100 mg Oral QHS  . famotidine  20 mg Oral Daily  . feeding supplement (NEPRO CARB STEADY)  237 mL Oral BID PC  . furosemide  40 mg  Intravenous Daily  . gabapentin  600 mg Oral BID  . hydrALAZINE  25 mg Oral TID  . insulin aspart  0-5 Units Subcutaneous QHS  . insulin aspart  0-9 Units Subcutaneous TID WC  . insulin glargine  17 Units Subcutaneous Daily  . isosorbide mononitrate  60 mg Oral Daily  . simvastatin  40 mg Oral QHS  . sodium chloride  3 mL Intravenous Q12H  . tiZANidine  2 mg Oral BID  . vitamin B-12  1,000 mcg Oral Daily  . vitamin C  500 mg Oral Daily  . warfarin  3 mg Oral q1800  . Warfarin - Pharmacist Dosing Inpatient   Does not apply Q24H   Continuous Infusions:    Laural Eiland, DO  [facility] Pager [phone]  If 7PM-7AM, please contact night-coverage [email or web address] Password [email or web address] 08/25/2014, 10:59 AM   LOS: 5 days

## 2014-08-25 NOTE — Telephone Encounter (Signed)
Pt is being [facility]F.  Please move up follow-up with Dr Diona BrownerMcDowell to 4 weeks (currently scheduled in April). Per Dr. Johney FrameAllred. Appointment has been re-scheduled.

## 2014-08-25 NOTE — Telephone Encounter (Signed)
Daughter of [name] called stating that her father is inpatient [facility]. Family is very concerned about father.  Wants to know why a cardiology doctor from our group has not been to see [name]. I explained to daughter that normally the doctor taking care of the patient in the hospital is the one that will contact other doctors if they want patient to be seen. Family is asking if they can receive a telephone call From Dr. Diona BrownerMcDowell to see if he can let them know what is going on with father. I told daughter that I would send note to Dr. Diona BrownerMcDowell.  She is requesting to call her cell #.

## 2014-08-28 ENCOUNTER — Emergency Department (HOSPITAL_COMMUNITY): Payer: Medicare Other

## 2014-08-28 ENCOUNTER — Inpatient Hospital Stay (HOSPITAL_COMMUNITY)
Admission: EM | Admit: 2014-08-28 | Discharge: 2014-09-03 | DRG: 280 | Disposition: A | Discharge status: Hospice | Payer: Medicare Other | Attending: Internal Medicine | Admitting: Internal Medicine

## 2014-08-28 ENCOUNTER — Encounter (HOSPITAL_COMMUNITY): Payer: Self-pay | Admitting: Emergency Medicine

## 2014-08-28 DIAGNOSIS — Z89512 Acquired absence of left leg below knee: Secondary | ICD-10-CM | POA: Diagnosis not present

## 2014-08-28 DIAGNOSIS — Z87891 Personal history of nicotine dependence: Secondary | ICD-10-CM | POA: Diagnosis not present

## 2014-08-28 DIAGNOSIS — I5023 Acute on chronic systolic (congestive) heart failure: Secondary | ICD-10-CM | POA: Diagnosis not present

## 2014-08-28 DIAGNOSIS — I2510 Atherosclerotic heart disease of native coronary artery without angina pectoris: Secondary | ICD-10-CM | POA: Diagnosis present

## 2014-08-28 DIAGNOSIS — J9621 Acute and chronic respiratory failure with hypoxia: Secondary | ICD-10-CM | POA: Diagnosis present

## 2014-08-28 DIAGNOSIS — R4182 Altered mental status, unspecified: Secondary | ICD-10-CM | POA: Diagnosis present

## 2014-08-28 DIAGNOSIS — I129 Hypertensive chronic kidney disease with stage 1 through stage 4 chronic kidney disease, or unspecified chronic kidney disease: Secondary | ICD-10-CM | POA: Diagnosis present

## 2014-08-28 DIAGNOSIS — M1990 Unspecified osteoarthritis, unspecified site: Secondary | ICD-10-CM | POA: Diagnosis present

## 2014-08-28 DIAGNOSIS — Y95 Nosocomial condition: Secondary | ICD-10-CM | POA: Diagnosis present

## 2014-08-28 DIAGNOSIS — I214 Non-ST elevation (NSTEMI) myocardial infarction: Secondary | ICD-10-CM | POA: Clinically undetermined

## 2014-08-28 DIAGNOSIS — R410 Disorientation, unspecified: Secondary | ICD-10-CM

## 2014-08-28 DIAGNOSIS — Z515 Encounter for palliative care: Secondary | ICD-10-CM | POA: Diagnosis not present

## 2014-08-28 DIAGNOSIS — Z8673 Personal history of transient ischemic attack (TIA), and cerebral infarction without residual deficits: Secondary | ICD-10-CM

## 2014-08-28 DIAGNOSIS — I480 Paroxysmal atrial fibrillation: Secondary | ICD-10-CM | POA: Diagnosis present

## 2014-08-28 DIAGNOSIS — H9190 Unspecified hearing loss, unspecified ear: Secondary | ICD-10-CM | POA: Diagnosis present

## 2014-08-28 DIAGNOSIS — E785 Hyperlipidemia, unspecified: Secondary | ICD-10-CM | POA: Diagnosis present

## 2014-08-28 DIAGNOSIS — Z951 Presence of aortocoronary bypass graft: Secondary | ICD-10-CM | POA: Diagnosis not present

## 2014-08-28 DIAGNOSIS — Z9981 Dependence on supplemental oxygen: Secondary | ICD-10-CM

## 2014-08-28 DIAGNOSIS — E1165 Type 2 diabetes mellitus with hyperglycemia: Secondary | ICD-10-CM | POA: Diagnosis present

## 2014-08-28 DIAGNOSIS — IMO0002 Reserved for concepts with insufficient information to code with codable children: Secondary | ICD-10-CM | POA: Diagnosis present

## 2014-08-28 DIAGNOSIS — N390 Urinary tract infection, site not specified: Secondary | ICD-10-CM | POA: Diagnosis present

## 2014-08-28 DIAGNOSIS — R509 Fever, unspecified: Secondary | ICD-10-CM | POA: Diagnosis present

## 2014-08-28 DIAGNOSIS — J189 Pneumonia, unspecified organism: Secondary | ICD-10-CM | POA: Diagnosis present

## 2014-08-28 DIAGNOSIS — Z09 Encounter for follow-up examination after completed treatment for conditions other than malignant neoplasm: Secondary | ICD-10-CM

## 2014-08-28 DIAGNOSIS — I4891 Unspecified atrial fibrillation: Secondary | ICD-10-CM | POA: Diagnosis present

## 2014-08-28 DIAGNOSIS — Z89511 Acquired absence of right leg below knee: Secondary | ICD-10-CM

## 2014-08-28 DIAGNOSIS — R5381 Other malaise: Secondary | ICD-10-CM | POA: Diagnosis present

## 2014-08-28 DIAGNOSIS — Z8249 Family history of ischemic heart disease and other diseases of the circulatory system: Secondary | ICD-10-CM

## 2014-08-28 DIAGNOSIS — I252 Old myocardial infarction: Secondary | ICD-10-CM | POA: Diagnosis not present

## 2014-08-28 DIAGNOSIS — I509 Heart failure, unspecified: Secondary | ICD-10-CM

## 2014-08-28 DIAGNOSIS — Z66 Do not resuscitate: Secondary | ICD-10-CM | POA: Diagnosis present

## 2014-08-28 DIAGNOSIS — I739 Peripheral vascular disease, unspecified: Secondary | ICD-10-CM | POA: Diagnosis present

## 2014-08-28 DIAGNOSIS — Z823 Family history of stroke: Secondary | ICD-10-CM

## 2014-08-28 DIAGNOSIS — N183 Chronic kidney disease, stage 3 (moderate): Secondary | ICD-10-CM | POA: Diagnosis present

## 2014-08-28 DIAGNOSIS — D649 Anemia, unspecified: Secondary | ICD-10-CM | POA: Diagnosis present

## 2014-08-28 DIAGNOSIS — R7989 Other specified abnormal findings of blood chemistry: Secondary | ICD-10-CM | POA: Clinically undetermined

## 2014-08-28 DIAGNOSIS — R778 Other specified abnormalities of plasma proteins: Secondary | ICD-10-CM | POA: Clinically undetermined

## 2014-08-28 DIAGNOSIS — J9620 Acute and chronic respiratory failure, unspecified whether with hypoxia or hypercapnia: Secondary | ICD-10-CM | POA: Diagnosis present

## 2014-08-28 DIAGNOSIS — J209 Acute bronchitis, unspecified: Secondary | ICD-10-CM

## 2014-08-28 HISTORY — DX: Heart failure, unspecified: I50.9

## 2014-08-28 LAB — CBC WITH DIFFERENTIAL/PLATELET
Basophils Absolute: 0 10*3/uL (ref 0.0–0.1)
Basophils Relative: 0 % (ref 0–1)
EOS ABS: 0.1 10*3/uL (ref 0.0–0.7)
EOS PCT: 1 % (ref 0–5)
HCT: 36.3 % — ABNORMAL LOW (ref 39.0–52.0)
Hemoglobin: 11.3 g/dL — ABNORMAL LOW (ref 13.0–17.0)
LYMPHS ABS: 0.8 10*3/uL (ref 0.7–4.0)
Lymphocytes Relative: 8 % — ABNORMAL LOW (ref 12–46)
MCH: 31 pg (ref 26.0–34.0)
MCHC: 31.1 g/dL (ref 30.0–36.0)
MCV: 99.5 fL (ref 78.0–100.0)
Monocytes Absolute: 0.7 10*3/uL (ref 0.1–1.0)
Monocytes Relative: 8 % (ref 3–12)
Neutro Abs: 7.5 10*3/uL (ref 1.7–7.7)
Neutrophils Relative %: 83 % — ABNORMAL HIGH (ref 43–77)
Platelets: 169 10*3/uL (ref 150–400)
RBC: 3.65 MIL/uL — ABNORMAL LOW (ref 4.22–5.81)
RDW: 12.9 % (ref 11.5–15.5)
WBC: 9.1 10*3/uL (ref 4.0–10.5)

## 2014-08-28 LAB — BASIC METABOLIC PANEL
Anion gap: 5 (ref 5–15)
BUN: 34 mg/dL — AB (ref 6–23)
CALCIUM: 8.4 mg/dL (ref 8.4–10.5)
CO2: 29 mmol/L (ref 19–32)
CREATININE: 1.61 mg/dL — AB (ref 0.50–1.35)
Chloride: 104 mmol/L (ref 96–112)
GFR calc Af Amer: 42 mL/min — ABNORMAL LOW (ref >90)
GFR calc non Af Amer: 36 mL/min — ABNORMAL LOW (ref >90)
Glucose, Bld: 342 mg/dL — ABNORMAL HIGH (ref 70–99)
Potassium: 4.7 mmol/L (ref 3.5–5.1)
Sodium: 138 mmol/L (ref 135–145)

## 2014-08-28 LAB — URINALYSIS, ROUTINE W REFLEX MICROSCOPIC
Bilirubin Urine: NEGATIVE
Glucose, UA: 250 mg/dL — AB
HGB URINE DIPSTICK: NEGATIVE
KETONES UR: NEGATIVE mg/dL
Leukocytes, UA: NEGATIVE
Nitrite: NEGATIVE
Protein, ur: 100 mg/dL — AB
Specific Gravity, Urine: 1.02 (ref 1.005–1.030)
UROBILINOGEN UA: 0.2 mg/dL (ref 0.0–1.0)
pH: 8 (ref 5.0–8.0)

## 2014-08-28 LAB — BRAIN NATRIURETIC PEPTIDE
B NATRIURETIC PEPTIDE 5: 715 pg/mL — AB (ref 0.0–100.0)
B Natriuretic Peptide: 615 pg/mL — ABNORMAL HIGH (ref 0.0–100.0)

## 2014-08-28 LAB — URINE MICROSCOPIC-ADD ON

## 2014-08-28 LAB — I-STAT CG4 LACTIC ACID, ED: Lactic Acid, Venous: 1.66 mmol/L (ref 0.5–2.0)

## 2014-08-28 MED ORDER — FAMOTIDINE 20 MG PO TABS
10.0000 mg | ORAL_TABLET | Freq: Every day | ORAL | Status: DC
  Administered 2014-08-29 – 2014-09-03 (×6): 10 mg via ORAL
  Filled 2014-08-28 (×6): qty 1

## 2014-08-28 MED ORDER — DOCUSATE SODIUM 100 MG PO CAPS
100.0000 mg | ORAL_CAPSULE | Freq: Every day | ORAL | Status: DC
  Administered 2014-08-29 – 2014-09-02 (×6): 100 mg via ORAL
  Filled 2014-08-28 (×6): qty 1

## 2014-08-28 MED ORDER — ACETAMINOPHEN 325 MG PO TABS
650.0000 mg | ORAL_TABLET | Freq: Four times a day (QID) | ORAL | Status: DC | PRN
  Administered 2014-08-31: 650 mg via ORAL
  Filled 2014-08-28 (×2): qty 2

## 2014-08-28 MED ORDER — VITAMIN D 1000 UNITS PO TABS
1000.0000 [IU] | ORAL_TABLET | Freq: Every day | ORAL | Status: DC
  Administered 2014-08-29 – 2014-09-03 (×6): 1000 [IU] via ORAL
  Filled 2014-08-28 (×6): qty 1

## 2014-08-28 MED ORDER — GUAIFENESIN ER 600 MG PO TB12
600.0000 mg | ORAL_TABLET | Freq: Two times a day (BID) | ORAL | Status: DC
  Administered 2014-08-29 – 2014-08-31 (×7): 600 mg via ORAL
  Filled 2014-08-28 (×7): qty 1

## 2014-08-28 MED ORDER — NITROGLYCERIN 0.4 MG SL SUBL: 0.4000 mg | SUBLINGUAL_TABLET | SUBLINGUAL | Status: DC | PRN

## 2014-08-28 MED ORDER — GABAPENTIN 300 MG PO CAPS
600.0000 mg | ORAL_CAPSULE | Freq: Every evening | ORAL | Status: DC | PRN
  Administered 2014-08-31: 600 mg via ORAL
  Filled 2014-08-28: qty 2

## 2014-08-28 MED ORDER — HYDROCODONE-ACETAMINOPHEN 5-325 MG PO TABS
1.0000 | ORAL_TABLET | ORAL | Status: DC | PRN
  Administered 2014-09-01 – 2014-09-03 (×6): 1 via ORAL
  Filled 2014-08-28 (×6): qty 1

## 2014-08-28 MED ORDER — VITAMIN C 500 MG PO TABS
500.0000 mg | ORAL_TABLET | Freq: Every day | ORAL | Status: DC
  Administered 2014-08-29 – 2014-09-03 (×6): 500 mg via ORAL
  Filled 2014-08-28 (×6): qty 1

## 2014-08-28 MED ORDER — ALBUTEROL SULFATE (2.5 MG/3ML) 0.083% IN NEBU
2.5000 mg | INHALATION_SOLUTION | Freq: Four times a day (QID) | RESPIRATORY_TRACT | Status: DC
  Administered 2014-08-28 – 2014-09-01 (×11): 2.5 mg via RESPIRATORY_TRACT
  Filled 2014-08-28 (×10): qty 3

## 2014-08-28 MED ORDER — LEVOFLOXACIN IN D5W 500 MG/100ML IV SOLN
500.0000 mg | Freq: Once | INTRAVENOUS | Status: AC
  Administered 2014-08-28: 500 mg via INTRAVENOUS
  Filled 2014-08-28: qty 100

## 2014-08-28 MED ORDER — NEPRO/CARBSTEADY PO LIQD
237.0000 mL | Freq: Two times a day (BID) | ORAL | Status: DC
  Administered 2014-08-29 – 2014-09-02 (×7): 237 mL via ORAL

## 2014-08-28 MED ORDER — INSULIN GLARGINE 100 UNIT/ML ~~LOC~~ SOLN
17.0000 [IU] | Freq: Two times a day (BID) | SUBCUTANEOUS | Status: DC
Start: 2014-08-28 — End: 2014-08-30
  Administered 2014-08-29 – 2014-08-30 (×4): 17 [IU] via SUBCUTANEOUS
  Filled 2014-08-28 (×6): qty 0.17

## 2014-08-28 MED ORDER — TIZANIDINE HCL 4 MG PO TABS
2.0000 mg | ORAL_TABLET | Freq: Two times a day (BID) | ORAL | Status: DC
  Administered 2014-08-29 – 2014-09-03 (×12): 2 mg via ORAL
  Filled 2014-08-28 (×21): qty 1

## 2014-08-28 MED ORDER — PIPERACILLIN-TAZOBACTAM 3.375 G IVPB
3.3750 g | Freq: Three times a day (TID) | INTRAVENOUS | Status: DC
  Administered 2014-08-29 – 2014-08-30 (×4): 3.375 g via INTRAVENOUS
  Filled 2014-08-28 (×7): qty 50

## 2014-08-28 MED ORDER — HYDRALAZINE HCL 25 MG PO TABS
25.0000 mg | ORAL_TABLET | Freq: Three times a day (TID) | ORAL | Status: DC
  Administered 2014-08-29 – 2014-09-03 (×17): 25 mg via ORAL
  Filled 2014-08-28 (×17): qty 1

## 2014-08-28 MED ORDER — VANCOMYCIN HCL IN DEXTROSE 1-5 GM/200ML-% IV SOLN
1000.0000 mg | INTRAVENOUS | Status: DC
  Administered 2014-08-29 (×2): 1000 mg via INTRAVENOUS
  Filled 2014-08-28 (×3): qty 200

## 2014-08-28 MED ORDER — ALBUTEROL SULFATE (2.5 MG/3ML) 0.083% IN NEBU
INHALATION_SOLUTION | RESPIRATORY_TRACT | Status: AC
  Filled 2014-08-28: qty 3

## 2014-08-28 MED ORDER — FUROSEMIDE 10 MG/ML IJ SOLN
40.0000 mg | Freq: Two times a day (BID) | INTRAMUSCULAR | Status: DC
  Administered 2014-08-29 – 2014-08-30 (×3): 40 mg via INTRAVENOUS
  Filled 2014-08-28 (×3): qty 4

## 2014-08-28 MED ORDER — SODIUM CHLORIDE 0.9 % IJ SOLN: 3.0000 mL | INTRAMUSCULAR | Status: DC | PRN

## 2014-08-28 MED ORDER — SIMVASTATIN 20 MG PO TABS
40.0000 mg | ORAL_TABLET | Freq: Every day | ORAL | Status: DC
  Administered 2014-08-29 – 2014-08-30 (×3): 40 mg via ORAL
  Filled 2014-08-28 (×3): qty 2

## 2014-08-28 MED ORDER — AZITHROMYCIN 500 MG IV SOLR
500.0000 mg | INTRAVENOUS | Status: DC
  Administered 2014-08-29: 500 mg via INTRAVENOUS
  Filled 2014-08-28 (×2): qty 500

## 2014-08-28 MED ORDER — ISOSORBIDE MONONITRATE ER 60 MG PO TB24
60.0000 mg | ORAL_TABLET | Freq: Every day | ORAL | Status: DC
  Administered 2014-08-29 – 2014-09-03 (×6): 60 mg via ORAL
  Filled 2014-08-28 (×6): qty 1

## 2014-08-28 MED ORDER — DEXTROSE 5 % IV SOLN
500.0000 mg | Freq: Every day | INTRAVENOUS | Status: DC
  Filled 2014-08-28: qty 500

## 2014-08-28 MED ORDER — INSULIN ASPART 100 UNIT/ML ~~LOC~~ SOLN
0.0000 [IU] | Freq: Every day | SUBCUTANEOUS | Status: DC
  Administered 2014-08-29: 4 [IU] via SUBCUTANEOUS
  Administered 2014-08-29: 3 [IU] via SUBCUTANEOUS
  Administered 2014-09-01: 2 [IU] via SUBCUTANEOUS

## 2014-08-28 MED ORDER — ACETAMINOPHEN 650 MG RE SUPP: 650.0000 mg | Freq: Four times a day (QID) | RECTAL | Status: DC | PRN

## 2014-08-28 MED ORDER — SODIUM CHLORIDE 0.9 % IV SOLN
250.0000 mL | INTRAVENOUS | Status: DC | PRN
  Administered 2014-08-29: 250 mL via INTRAVENOUS

## 2014-08-28 MED ORDER — SODIUM CHLORIDE 0.9 % IJ SOLN
3.0000 mL | Freq: Two times a day (BID) | INTRAMUSCULAR | Status: DC
  Administered 2014-08-28 – 2014-09-02 (×6): 3 mL via INTRAVENOUS

## 2014-08-28 MED ORDER — SODIUM CHLORIDE 0.9 % IV SOLN: INTRAVENOUS | Status: DC

## 2014-08-28 MED ORDER — INSULIN ASPART 100 UNIT/ML ~~LOC~~ SOLN
0.0000 [IU] | Freq: Three times a day (TID) | SUBCUTANEOUS | Status: DC
  Administered 2014-08-29 (×2): 9 [IU] via SUBCUTANEOUS

## 2014-08-28 MED ORDER — METHYLPREDNISOLONE SODIUM SUCC 125 MG IJ SOLR
125.0000 mg | Freq: Once | INTRAMUSCULAR | Status: AC
  Administered 2014-08-28: 125 mg via INTRAVENOUS
  Filled 2014-08-28: qty 2

## 2014-08-28 MED ORDER — IPRATROPIUM BROMIDE 0.02 % IN SOLN: 0.5000 mg | Freq: Once | RESPIRATORY_TRACT | Status: DC

## 2014-08-28 MED ORDER — ALBUTEROL SULFATE (2.5 MG/3ML) 0.083% IN NEBU: 5.0000 mg | INHALATION_SOLUTION | Freq: Once | RESPIRATORY_TRACT | Status: DC

## 2014-08-28 MED ORDER — ACETAMINOPHEN 325 MG PO TABS
650.0000 mg | ORAL_TABLET | Freq: Once | ORAL | Status: AC
  Administered 2014-08-28: 650 mg via ORAL
  Filled 2014-08-28: qty 2

## 2014-08-28 MED ORDER — ALBUTEROL SULFATE (2.5 MG/3ML) 0.083% IN NEBU: 2.5000 mg | INHALATION_SOLUTION | Freq: Four times a day (QID) | RESPIRATORY_TRACT | Status: DC

## 2014-08-28 MED ORDER — IPRATROPIUM-ALBUTEROL 0.5-2.5 (3) MG/3ML IN SOLN
3.0000 mL | Freq: Once | RESPIRATORY_TRACT | Status: AC
Start: 2014-08-28 — End: 2014-08-28
  Administered 2014-08-28: 3 mL via RESPIRATORY_TRACT
  Filled 2014-08-28: qty 3

## 2014-08-28 MED ORDER — VITAMIN B-12 1000 MCG PO TABS
1000.0000 ug | ORAL_TABLET | Freq: Every day | ORAL | Status: DC
  Administered 2014-08-29 – 2014-09-03 (×6): 1000 ug via ORAL
  Filled 2014-08-28 (×6): qty 1

## 2014-08-28 MED ORDER — SODIUM CHLORIDE 0.9 % IJ SOLN
3.0000 mL | Freq: Two times a day (BID) | INTRAMUSCULAR | Status: DC
  Administered 2014-08-28 – 2014-09-03 (×5): 3 mL via INTRAVENOUS

## 2014-08-28 MED ORDER — ALBUTEROL SULFATE (2.5 MG/3ML) 0.083% IN NEBU
2.5000 mg | INHALATION_SOLUTION | Freq: Once | RESPIRATORY_TRACT | Status: AC
  Administered 2014-08-28: 2.5 mg via RESPIRATORY_TRACT
  Filled 2014-08-28: qty 3

## 2014-08-28 NOTE — ED Provider Notes (Signed)
CSN: [id number]     Arrival date & time 08/28/14  1556 History  This chart was scribed for Flint Melter, MD by Arlan Organ, ED Scribe. This patient was seen in room APA14/APA14 and the patient's care was started 4:01 PM.   Chief Complaint  Patient presents with  . desaturation    The history is provided by the patient. No language interpreter was used.   LEVEL 5 CAVEAT: DEMENTIA  HPI Comments: Tyler Moon is a 81 y.o. male with a PMHx of A-Fib, TIA, DM, hyperlipidemia, peripheral arterial disease, MI, and CHF who presents to the Emergency Department here for desaturation onset just prior to arrival. Pt presents from [facility] with decreased O2 saturation in 70's on room air. Pt also had a fever of 100.1 with worsening confusion.  Past Medical History  Diagnosis Date  . Atrial fibrillation   . Coronary atherosclerosis of native coronary artery     Multivessel  . TIA (transient ischemic attack)   . Anemia   . GI bleed   . Diabetes type 2, controlled   . Hyperlipidemia   . Peripheral arterial disease   . Essential hypertension, benign   . Partial deafness   . MI (myocardial infarction)   . S/P bilateral BKA (below knee amputation)   . Complete heart block     On sotalol  . First degree heart block   . Mitral regurgitation     mild-moderate  . Arthritis   . Neuromuscular disorder     neuropathy  . CHF (congestive heart failure)    Past Surgical History  Procedure Laterality Date  . Coronary artery bypass graft  1994  . Transurethral resection of prostate    . Below knee leg amputation      Bilateral  . Cataract extraction     Family History  Problem Relation Age of Onset  . Heart disease Mother   . Stroke Father    History  Substance Use Topics  . Smoking status: Former Smoker    Types: Cigarettes  . Smokeless tobacco: Never Used     Comment: Quit 40 years ago  . Alcohol Use: No   Review of Systems  Unable to perform ROS: Dementia  Respiratory: Negative  for wheezing.        Hypoxia.  Psychiatric/Behavioral: Positive for confusion.  All other systems reviewed and are negative.  Allergies  Cephalexin and Ciprofloxacin  Home Medications   Prior to Admission medications   Medication Sig Start Date End Date Taking? Authorizing Provider  albuterol (PROVENTIL) (2.5 MG/3ML) 0.083% nebulizer solution Take 2.5 mg by nebulization every 6 (six) hours. For cough and congestion   Yes Historical Provider, MD  cholecalciferol (VITAMIN D) 1000 UNITS tablet Take 1,000 Units by mouth daily.   Yes Historical Provider, MD  docusate sodium (COLACE) 100 MG capsule Take 100 mg by mouth at bedtime.    Yes Historical Provider, MD  furosemide (LASIX) 40 MG tablet Take 1 tablet (40 mg total) by mouth daily. 08/26/14  Yes Catarina Hartshorn, MD  gabapentin (NEURONTIN) 600 MG tablet Take 600 mg by mouth daily as needed (for neuropathy. **May only be given at betime per [facility]**). **May only be given at bedtime per [facility]**   Yes Historical Provider, MD  guaiFENesin (MUCINEX) 600 MG 12 hr tablet Take 600 mg by mouth 2 (two) times daily.   Yes Historical Provider, MD  hydrALAZINE (APRESOLINE) 25 MG tablet Take 1 tablet (25 mg total) by mouth 3 (three)  times daily. New medication for high blood pressure treatment. 07/09/14  Yes Elliot Cousinenise Fisher, MD  HYDROcodone-acetaminophen (NORCO/VICODIN) 5-325 MG per tablet Take 1 tablet by mouth every 4 (four) hours as needed for moderate pain. 08/25/14  Yes Catarina Hartshornavid Tat, MD  insulin aspart (NOVOLOG) 100 UNIT/ML injection Sliding-scale NovoLog 3 times daily: For blood sugar 70 to 130-no insulin. Blood sugar 131-150-give 3 units insulin. Blood sugar 151-200-give 4 units. Blood sugar 201-250-give 6 units. Blood sugar 231-300-give 9 units. Blood sugar 301-350-give 12 units. Blood sugar 351-400-give 15 units. Blood sugar greater than 400 give 20 units and call your Dr. Patient taking differently: Inject 3-20 Units into the skin 3 (three) times daily. Per Sliding  Scale  If 201-250= 2 units   251-300=  4 units   301-350=  6 units   351-400=  8 units If ove400= 10 units and call MD/NP 07/09/14  Yes Elliot Cousinenise Fisher, MD  insulin glargine (LANTUS) 100 UNIT/ML injection Inject 0.17 mLs (17 Units total) into the skin 2 (two) times daily. 07/09/14  Yes Elliot Cousinenise Fisher, MD  isosorbide mononitrate (IMDUR) 60 MG 24 hr tablet Take 60 mg by mouth daily.     Yes Historical Provider, MD  Nutritional Supplements (FEEDING SUPPLEMENT, NEPRO CARB STEADY,) LIQD Take 237 mLs by mouth 2 (two) times daily after a meal. 08/25/14  Yes Catarina Hartshornavid Tat, MD  OXYGEN Inhale 2 L into the lungs daily.   Yes Historical Provider, MD  ranitidine (ZANTAC) 150 MG tablet Take 150 mg by mouth 2 (two) times daily.   Yes Historical Provider, MD  simvastatin (ZOCOR) 40 MG tablet Take 40 mg by mouth at bedtime.     Yes Historical Provider, MD  tiZANidine (ZANAFLEX) 4 MG capsule Take 2 mg by mouth 2 (two) times daily.    Yes Historical Provider, MD  vitamin B-12 (CYANOCOBALAMIN) 1000 MCG tablet Take 1,000 mcg by mouth daily.     Yes Historical Provider, MD  warfarin (COUMADIN) 5 MG tablet Take 7.5 mg by mouth every evening.    Yes Historical Provider, MD  nitroGLYCERIN (NITROSTAT) 0.4 MG SL tablet Place 0.4 mg under the tongue every 5 (five) minutes as needed for chest pain.    Historical Provider, MD  vitamin C (ASCORBIC ACID) 500 MG tablet Take 500 mg by mouth daily.      Historical Provider, MD   Triage Vitals: BP 105/55 mmHg  Pulse 72  Temp(Src) 102.8 F (39.3 C) (Rectal)  Resp 22  Wt 189 lb 9.6 oz (86.002 kg)  SpO2 90%   Physical Exam  Constitutional: He appears well-developed.  Elderly and frail.  HENT:  Head: Normocephalic and atraumatic.  Right Ear: External ear normal.  Left Ear: External ear normal.  Eyes: Conjunctivae and EOM are normal. Pupils are equal, round, and reactive to light.  Neck: Normal range of motion and phonation normal. Neck supple.  Cardiovascular: Normal rate,  regular rhythm, normal heart sounds and intact distal pulses.   No JVD.  Pulmonary/Chest: Effort normal. No respiratory distress. He exhibits no bony tenderness.  generalized rhonchi without wheezing or rales. No increased work of breathing.  Abdominal: Soft. He exhibits no distension. There is no tenderness.  Musculoskeletal: Normal range of motion.  Bilateral BKA  Neurological: He is alert. No cranial nerve deficit or sensory deficit. He exhibits normal muscle tone. Coordination normal.  Skin: Skin is warm, dry and intact.  Psychiatric: He has a normal mood and affect. His behavior is normal.  Nursing note and vitals reviewed.  ED Course  Procedures (including critical care time)  DIAGNOSTIC STUDIES: Oxygen Saturation is 94% on [place], low by my interpretation.    COORDINATION OF CARE: 4:08 PM-Discussed plans to order diagnostic imaging of chest and lab work. Will give patient nebulizer breathing treatment and IV fluids. Discussed treatment plan with pt at bedside and pt agreed to plan.    5:52 PM- Per daughter, patient is disoriented. She states this is unusual from patient at baseline. Daughter states that patient's skin was cool when given food at 3:00PM. Discussed imaging results with family. Daughter states that patient is receiving 40 ounces of fluid per day. Discussed plans to give patient Prednisone. Will monitor patient. Daughter states that patient was seen by NP earlier today and was advised to use a BIPAP. Patient has productive cough with green sputum   20:20- case was discussed with Dr. Selena Batten, his PCP, who saw the patient in the emergency department.  He decided to admit the patient   CRITICAL CARE Performed by: Flint Melter Total critical care time: 35 minutes Critical care time was exclusive of separately billable procedures and treating other patients. Critical care was necessary to treat or prevent imminent or life-threatening deterioration. Critical care was time  spent personally by me on the following activities: development of treatment plan with patient and/or surrogate as well as nursing, discussions with consultants, evaluation of patient's response to treatment, examination of patient, obtaining history from patient or surrogate, ordering and performing treatments and interventions, ordering and review of laboratory studies, ordering and review of radiographic studies, pulse oximetry and re-evaluation of patient's condition.   Labs Review Labs Reviewed  CBC WITH DIFFERENTIAL/PLATELET - Abnormal; Notable for the following:    RBC 3.65 (*)    Hemoglobin 11.3 (*)    HCT 36.3 (*)    Neutrophils Relative % 83 (*)    Lymphocytes Relative 8 (*)    All other components within normal limits  BASIC METABOLIC PANEL - Abnormal; Notable for the following:    Glucose, Bld 342 (*)    BUN 34 (*)    Creatinine, Ser 1.61 (*)    GFR calc non Af Amer 36 (*)    GFR calc Af Amer 42 (*)    All other components within normal limits  URINALYSIS, ROUTINE W REFLEX MICROSCOPIC - Abnormal; Notable for the following:    APPearance CLOUDY (*)    Glucose, UA 250 (*)    Protein, ur 100 (*)    All other components within normal limits  BRAIN NATRIURETIC PEPTIDE - Abnormal; Notable for the following:    B Natriuretic Peptide 615.0 (*)    All other components within normal limits  URINE MICROSCOPIC-ADD ON - Abnormal; Notable for the following:    Bacteria, UA MANY (*)    All other components within normal limits  CULTURE, BLOOD (ROUTINE X 2)  CULTURE, BLOOD (ROUTINE X 2)  URINE CULTURE  I-STAT CG4 LACTIC ACID, ED   Imaging Review Dg Chest Portable 1 View  08/28/2014   CLINICAL DATA:  Cough and fever.  EXAM: PORTABLE CHEST - 1 VIEW  COMPARISON:  08/24/2014 and 08/20/2014  FINDINGS: There is chronic cardiomegaly. Moderate left effusion persists. Small right effusion has diminished. Pulmonary edema has improved and the pulmonary vascularity is now normal.  IMPRESSION:  Improved pulmonary edema. Resolution of pulmonary vascular congestion. Decreased right effusion. Stable moderate left effusion.   Electronically Signed   By: Geanie Cooley M.D.   On: 08/28/2014 17:03  EKG Interpretation None      MDM   Final diagnoses:  Acute bronchitis, unspecified organism  Congestive heart failure, unspecified congestive heart failure chronicity, unspecified congestive heart failure type  UTI (lower urinary tract infection)  Confusion    Fever with altered mental status.  Chest x-ray and BNP indicating improving congestive heart failure.  Clinical exam is consistent with acute bronchitis.  Urinalysis is abnormal may indicate urinary tract infection, urine cultures pending.  Ongoing renal insufficiency, stable creatinine.  Nursing Notes Reviewed/ Care Coordinated, and agree without changes. Applicable Imaging Reviewed.  Interpretation of Laboratory Data incorporated into ED treatment  I personally performed the services described in this documentation, which was scribed in my presence. The recorded information has been reviewed and is accurate.      Flint Melter, MD 08/28/14 [phone]

## 2014-08-28 NOTE — ED Notes (Signed)
Pt from [facility] with decreased O2 saturation of 70's on Room Air. Pt also had fever of 100.1 and increased confusion.

## 2014-08-28 NOTE — Progress Notes (Signed)
ANTIBIOTIC CONSULT NOTE  Pharmacy Consult for Vancomycin & Zosyn Indication: pneumonia,UTI  Allergies  Allergen Reactions  . Cephalexin Other (See Comments)    Unknown   . Ciprofloxacin Other (See Comments)    Unknown     Patient Measurements: Height: 6' (182.9 cm) Weight: 172 lb 3.2 oz (78.109 kg) IBW/kg (Calculated) : 77.6  Vital Signs: Temp: 97.5 F (36.4 C) (02/07 2202) Temp Source: Axillary (02/07 2202) BP: 134/65 mmHg (02/07 2202) Pulse Rate: 77 (02/07 2202) Intake/Output from previous day:   Intake/Output from this shift:    Labs:  Recent Labs  08/28/14 1610  WBC 9.1  HGB 11.3*  PLT 169  CREATININE 1.61*   Estimated Creatinine Clearance: 34.1 mL/min (by C-G formula based on Cr of 1.61). No results for input(s): VANCOTROUGH, VANCOPEAK, VANCORANDOM, GENTTROUGH, GENTPEAK, GENTRANDOM, TOBRATROUGH, TOBRAPEAK, TOBRARND, AMIKACINPEAK, AMIKACINTROU, AMIKACIN in the last 72 hours.   Microbiology: No results found for this or any previous visit (from the past 720 hour(s)).  Anti-infectives    Start     Dose/Rate Route Frequency Ordered Stop   08/29/14 1800  azithromycin (ZITHROMAX) 500 mg in dextrose 5 % 250 mL IVPB     500 mg250 mL/hr over 60 Minutes Intravenous Every 24 hours 08/28/14 2238     08/29/14 1000  piperacillin-tazobactam (ZOSYN) IVPB 3.375 g     3.375 g12.5 mL/hr over 240 Minutes Intravenous Every 8 hours 08/28/14 2238     08/28/14 2300  vancomycin (VANCOCIN) IVPB 1000 mg/200 mL premix     1,000 mg200 mL/hr over 60 Minutes Intravenous Every 24 hours 08/28/14 2238     08/28/14 2230  azithromycin (ZITHROMAX) 500 mg in dextrose 5 % 250 mL IVPB  Status:  Discontinued     500 mg250 mL/hr over 60 Minutes Intravenous Daily at bedtime 08/28/14 2219 08/28/14 2238   08/28/14 1815  levofloxacin (LEVAQUIN) IVPB 500 mg     500 mg100 mL/hr over 60 Minutes Intravenous  Once 08/28/14 1802 08/28/14 1910      Assessment: 81 yo M admitted from NH with fever (Tm  102.40F), decreased O2 sats, and increased confusion.   Empiric antibiotics started for PNA vs UTI.   Chronic renal insufficiency noted- at patient's baseline.  Estimated CrCl ~ 30-8835ml/min. Received Levaquin x1 dose in ED.  Vancomycin 2/7>> Zosyn 2/8>> Levaquin 2/7>>2/7  Goal of Therapy:  Vancomycin trough level 15-20 mcg/ml  Plan:  Zosyn 3.375gm IV Q8h to be infused over 4hrs Vancomycin 1gm IV q24h Check Vancomycin trough at steady state Monitor renal function and cx data   Elson ClanLilliston, Satrina Magallanes Michelle 08/28/2014,10:40 PM

## 2014-08-28 NOTE — H&P (Signed)
Tyler Moon is an 81 y.o. male.    Pcp:    Chief Complaint: ams HPI: 81 yo male with CHF (EF 35-40%), CAD, Dm2, apparently had some question of altered ms, and fever while at SNF.  Pt appears to have increased o2 requirement as well.  Pt sent to ED for evaluation.  Pt was noted to have possible uti, along with CHF on CXR which has improved.  Unable to tell if there could be any pneumonia on the LL base.  Pt will be admitted for w/up of possible pneumonia/uti.     Past Medical History  Diagnosis Date  . Atrial fibrillation   . Coronary atherosclerosis of native coronary artery     Multivessel  . TIA (transient ischemic attack)   . Anemia   . GI bleed   . Diabetes type 2, controlled   . Hyperlipidemia   . Peripheral arterial disease   . Essential hypertension, benign   . Partial deafness   . MI (myocardial infarction)   . S/P bilateral BKA (below knee amputation)   . Complete heart block     On sotalol  . First degree heart block   . Mitral regurgitation     mild-moderate  . Arthritis   . Neuromuscular disorder     neuropathy  . CHF (congestive heart failure)     Past Surgical History  Procedure Laterality Date  . Coronary artery bypass graft  1994  . Transurethral resection of prostate    . Below knee leg amputation      Bilateral  . Cataract extraction      Family History  Problem Relation Age of Onset  . Heart disease Mother   . Stroke Father    Social History:  reports that he has quit smoking. His smoking use included Cigarettes. He has never used smokeless tobacco. He reports that he does not drink alcohol or use illicit drugs.  Allergies:  Allergies  Allergen Reactions  . Cephalexin Other (See Comments)    Unknown   . Ciprofloxacin Other (See Comments)    Unknown    Medications reviewed  (Not in a hospital admission)  Results for orders placed or performed during the hospital encounter of 08/28/14 (from the past 48 hour(s))  CBC with Differential      Status: Abnormal   Collection Time: 08/28/14  4:10 PM  Result Value Ref Range   WBC 9.1 4.0 - 10.5 K/uL   RBC 3.65 (L) 4.22 - 5.81 MIL/uL   Hemoglobin 11.3 (L) 13.0 - 17.0 g/dL   HCT 36.3 (L) 39.0 - 52.0 %   MCV 99.5 78.0 - 100.0 fL   MCH 31.0 26.0 - 34.0 pg   MCHC 31.1 30.0 - 36.0 g/dL   RDW 12.9 11.5 - 15.5 %   Platelets 169 150 - 400 K/uL   Neutrophils Relative % 83 (H) 43 - 77 %   Neutro Abs 7.5 1.7 - 7.7 K/uL   Lymphocytes Relative 8 (L) 12 - 46 %   Lymphs Abs 0.8 0.7 - 4.0 K/uL   Monocytes Relative 8 3 - 12 %   Monocytes Absolute 0.7 0.1 - 1.0 K/uL   Eosinophils Relative 1 0 - 5 %   Eosinophils Absolute 0.1 0.0 - 0.7 K/uL   Basophils Relative 0 0 - 1 %   Basophils Absolute 0.0 0.0 - 0.1 K/uL  Basic metabolic panel     Status: Abnormal   Collection Time: 08/28/14  4:10 PM  Result  Value Ref Range   Sodium 138 135 - 145 mmol/L   Potassium 4.7 3.5 - 5.1 mmol/L   Chloride 104 96 - 112 mmol/L   CO2 29 19 - 32 mmol/L   Glucose, Bld 342 (H) 70 - 99 mg/dL   BUN 34 (H) 6 - 23 mg/dL   Creatinine, Ser 1.61 (H) 0.50 - 1.35 mg/dL   Calcium 8.4 8.4 - 10.5 mg/dL   GFR calc non Af Amer 36 (L) >90 mL/min   GFR calc Af Amer 42 (L) >90 mL/min    Comment: (NOTE) The eGFR has been calculated using the CKD EPI equation. This calculation has not been validated in all clinical situations. eGFR's persistently <90 mL/min signify possible Chronic Kidney Disease.    Anion gap 5 5 - 15  Brain natriuretic peptide     Status: Abnormal   Collection Time: 08/28/14  4:10 PM  Result Value Ref Range   B Natriuretic Peptide 615.0 (H) 0.0 - 100.0 pg/mL  Urinalysis, Routine w reflex microscopic     Status: Abnormal   Collection Time: 08/28/14  4:12 PM  Result Value Ref Range   Color, Urine YELLOW YELLOW   APPearance CLOUDY (A) CLEAR   Specific Gravity, Urine 1.020 1.005 - 1.030   pH 8.0 5.0 - 8.0   Glucose, UA 250 (A) NEGATIVE mg/dL   Hgb urine dipstick NEGATIVE NEGATIVE   Bilirubin Urine  NEGATIVE NEGATIVE   Ketones, ur NEGATIVE NEGATIVE mg/dL   Protein, ur 100 (A) NEGATIVE mg/dL   Urobilinogen, UA 0.2 0.0 - 1.0 mg/dL   Nitrite NEGATIVE NEGATIVE   Leukocytes, UA NEGATIVE NEGATIVE  Urine microscopic-add on     Status: Abnormal   Collection Time: 08/28/14  4:12 PM  Result Value Ref Range   WBC, UA 3-6 <3 WBC/hpf   Bacteria, UA MANY (A) RARE  I-Stat CG4 Lactic Acid, ED     Status: None   Collection Time: 08/28/14  4:32 PM  Result Value Ref Range   Lactic Acid, Venous 1.66 0.5 - 2.0 mmol/L   Dg Chest Portable 1 View  08/28/2014   CLINICAL DATA:  Cough and fever.  EXAM: PORTABLE CHEST - 1 VIEW  COMPARISON:  08/24/2014 and 08/20/2014  FINDINGS: There is chronic cardiomegaly. Moderate left effusion persists. Small right effusion has diminished. Pulmonary edema has improved and the pulmonary vascularity is now normal.  IMPRESSION: Improved pulmonary edema. Resolution of pulmonary vascular congestion. Decreased right effusion. Stable moderate left effusion.   Electronically Signed   By: Rozetta Nunnery M.D.   On: 08/28/2014 17:03    Review of Systems  Constitutional: Positive for fever. Negative for chills, weight loss, malaise/fatigue and diaphoresis.  HENT: Negative for congestion, ear discharge, ear pain, hearing loss, nosebleeds, sore throat and tinnitus.   Eyes: Negative for blurred vision, double vision, photophobia, pain, discharge and redness.  Respiratory: Positive for cough, sputum production and shortness of breath. Negative for hemoptysis, wheezing and stridor.   Cardiovascular: Negative for chest pain, palpitations, orthopnea, claudication, leg swelling and PND.  Gastrointestinal: Negative for heartburn, nausea, vomiting, abdominal pain, diarrhea, constipation, blood in stool and melena.  Genitourinary: Negative for dysuria, urgency, frequency, hematuria and flank pain.  Musculoskeletal: Negative for myalgias, back pain, joint pain, falls and neck pain.  Skin: Negative  for itching and rash.  Neurological: Negative for dizziness, tingling, tremors, sensory change, speech change, focal weakness, seizures, loss of consciousness, weakness and headaches.  Endo/Heme/Allergies: Negative for environmental allergies and polydipsia. Does not bruise/bleed  easily.  Psychiatric/Behavioral: Negative for depression, suicidal ideas, hallucinations and substance abuse. The patient is not nervous/anxious and does not have insomnia.     Blood pressure 125/78, pulse 91, temperature 101.8 F (38.8 C), temperature source Rectal, resp. rate 15, weight 86.002 kg (189 lb 9.6 oz), SpO2 100 %. Physical Exam  Constitutional: He is oriented to person, place, and time. He appears well-developed and well-nourished.  HENT:  Head: Normocephalic and atraumatic.  Mouth/Throat: No oropharyngeal exudate.  Eyes: Conjunctivae and EOM are normal. Pupils are equal, round, and reactive to light. No scleral icterus.  Neck: Normal range of motion. Neck supple. No JVD present. No thyromegaly present.  Cardiovascular: Normal rate and regular rhythm.  Exam reveals no gallop and no friction rub.   No murmur heard. Respiratory: Effort normal. He has no wheezes. He has rales. He exhibits no tenderness.  GI: Soft. Bowel sounds are normal. He exhibits no distension. There is no tenderness. There is no rebound and no guarding.  Musculoskeletal: Normal range of motion. He exhibits no edema or tenderness.  Lymphadenopathy:    He has no cervical adenopathy.  Neurological: He is alert and oriented to person, place, and time. He has normal reflexes. He displays normal reflexes. No cranial nerve deficit. He exhibits normal muscle tone. Coordination normal.  Skin: Skin is warm and dry. No rash noted. No erythema. No pallor.  bka  Psychiatric: He has a normal mood and affect. His behavior is normal. Judgment and thought content normal.     Assessment/Plan Fever ddx pneumonia, uti Blood culture x2 Urine culture  pending Empirically cover with vanco, zosyn zithromax  CHF (EF 35-40%) D/c po lasix Since he will be receiving additional fluid with abx Lasix 65m iv bid for now,  Cont hydralazine for afterload reduction Cont imdur  AMS Possibly secondary to infection Pco2 was high, at 51 Consider bipap or cpap at nite Pt appears to be improving  Anemia Check cbc in am  CKD stage 3/4 Check cmp in am  Pubic rami fracture Please arrange PT/OT evaluation in am  Dm2 fsbs ac and qhs, iss  Pafib (Chads2 >=3) DVT prophylaxis  Scd,  Coumadin pharmacy to dose  KJani Gravel2/01/2015, 8:25 PM

## 2014-08-28 NOTE — ED Notes (Signed)
Pt had on condom cath so U/A and UC obtained via port on catheter bag tubing.

## 2014-08-29 ENCOUNTER — Encounter (HOSPITAL_COMMUNITY): Payer: Self-pay

## 2014-08-29 ENCOUNTER — Inpatient Hospital Stay (HOSPITAL_COMMUNITY): Payer: Medicare Other

## 2014-08-29 DIAGNOSIS — R509 Fever, unspecified: Secondary | ICD-10-CM

## 2014-08-29 DIAGNOSIS — I509 Heart failure, unspecified: Secondary | ICD-10-CM

## 2014-08-29 DIAGNOSIS — R4182 Altered mental status, unspecified: Secondary | ICD-10-CM

## 2014-08-29 LAB — MRSA PCR SCREENING: MRSA by PCR: NEGATIVE

## 2014-08-29 LAB — CBC WITH DIFFERENTIAL/PLATELET
Basophils Absolute: 0 10*3/uL (ref 0.0–0.1)
Basophils Relative: 0 % (ref 0–1)
EOS ABS: 0 10*3/uL (ref 0.0–0.7)
Eosinophils Relative: 0 % (ref 0–5)
HCT: 36.3 % — ABNORMAL LOW (ref 39.0–52.0)
Hemoglobin: 11.1 g/dL — ABNORMAL LOW (ref 13.0–17.0)
Lymphocytes Relative: 5 % — ABNORMAL LOW (ref 12–46)
Lymphs Abs: 0.3 10*3/uL — ABNORMAL LOW (ref 0.7–4.0)
MCH: 30.7 pg (ref 26.0–34.0)
MCHC: 30.6 g/dL (ref 30.0–36.0)
MCV: 100.3 fL — AB (ref 78.0–100.0)
MONO ABS: 0.1 10*3/uL (ref 0.1–1.0)
Monocytes Relative: 2 % — ABNORMAL LOW (ref 3–12)
NEUTROS PCT: 93 % — AB (ref 43–77)
Neutro Abs: 5.3 10*3/uL (ref 1.7–7.7)
PLATELETS: 148 10*3/uL — AB (ref 150–400)
RBC: 3.62 MIL/uL — ABNORMAL LOW (ref 4.22–5.81)
RDW: 12.7 % (ref 11.5–15.5)
WBC: 5.7 10*3/uL (ref 4.0–10.5)

## 2014-08-29 LAB — COMPREHENSIVE METABOLIC PANEL
ALT: 20 U/L (ref 0–53)
ANION GAP: 8 (ref 5–15)
AST: 26 U/L (ref 0–37)
Albumin: 2.6 g/dL — ABNORMAL LOW (ref 3.5–5.2)
Alkaline Phosphatase: 58 U/L (ref 39–117)
BILIRUBIN TOTAL: 0.9 mg/dL (ref 0.3–1.2)
BUN: 43 mg/dL — ABNORMAL HIGH (ref 6–23)
CALCIUM: 8.8 mg/dL (ref 8.4–10.5)
CO2: 30 mmol/L (ref 19–32)
Chloride: 100 mmol/L (ref 96–112)
Creatinine, Ser: 1.7 mg/dL — ABNORMAL HIGH (ref 0.50–1.35)
GFR calc non Af Amer: 34 mL/min — ABNORMAL LOW (ref >90)
GFR, EST AFRICAN AMERICAN: 39 mL/min — AB (ref >90)
Glucose, Bld: 479 mg/dL — ABNORMAL HIGH (ref 70–99)
Potassium: 4.5 mmol/L (ref 3.5–5.1)
Sodium: 138 mmol/L (ref 135–145)
Total Protein: 5.9 g/dL — ABNORMAL LOW (ref 6.0–8.3)

## 2014-08-29 LAB — GLUCOSE, CAPILLARY: Glucose-Capillary: 399 mg/dL — ABNORMAL HIGH (ref 70–99)

## 2014-08-29 MED ORDER — INSULIN ASPART 100 UNIT/ML ~~LOC~~ SOLN
0.0000 [IU] | Freq: Three times a day (TID) | SUBCUTANEOUS | Status: DC
  Administered 2014-08-29: 11 [IU] via SUBCUTANEOUS
  Administered 2014-08-30: 8 [IU] via SUBCUTANEOUS
  Administered 2014-08-30: 3 [IU] via SUBCUTANEOUS
  Administered 2014-08-30: 5 [IU] via SUBCUTANEOUS
  Administered 2014-08-31: 2 [IU] via SUBCUTANEOUS
  Administered 2014-08-31: 3 [IU] via SUBCUTANEOUS
  Administered 2014-08-31: 5 [IU] via SUBCUTANEOUS
  Administered 2014-09-01: 3 [IU] via SUBCUTANEOUS
  Administered 2014-09-01: 5 [IU] via SUBCUTANEOUS
  Administered 2014-09-02: 2 [IU] via SUBCUTANEOUS
  Administered 2014-09-02 – 2014-09-03 (×2): 3 [IU] via SUBCUTANEOUS

## 2014-08-29 MED ORDER — TIZANIDINE HCL 4 MG PO TABS
ORAL_TABLET | ORAL | Status: AC
  Filled 2014-08-29: qty 1

## 2014-08-29 MED ORDER — VANCOMYCIN HCL IN DEXTROSE 1-5 GM/200ML-% IV SOLN
INTRAVENOUS | Status: AC
  Filled 2014-08-29: qty 200

## 2014-08-29 NOTE — Clinical Social Work Psychosocial (Signed)
Clinical Social Work Department BRIEF PSYCHOSOCIAL ASSESSMENT 08/29/2014  Patient:  Tyler DolphinCARTER,Hussam R     Account Number:  [id number]     Admit date:  08/28/2014  Clinical Social Worker:  Robin SearingALDWELL,Kaden Daughdrill, LCSWA  Date/Time:  08/29/2014 12:29 PM  Referred by:  Physician  Date Referred:  08/29/2014 Referred for  SNF Placement   Other Referral:   Interview type:  Family Other interview type:   daughterEber Jones- [name] [phone]    PSYCHOSOCIAL DATA Living Status:  FACILITY Admitted from facility:  [facility] Level of care:  Skilled Nursing Facility Primary support name:  spouse- Primary support relationship to patient:  FAMILY Degree of support available:   good    CURRENT CONCERNS Current Concerns  Post-Acute Placement   Other Concerns:    SOCIAL WORK ASSESSMENT / PLAN CSW spoke with family to confirm patient admitted from [facility]- Per family, they are anticipating his return at d/c. Patient's wife of 68 years is at bedside- she reports he was only admitted there on last Thursday and prior to that was at [facility].   Assessment/plan status:  Other - See comment Other assessment/ plan:   CSW will update FL2 for SNF return   Information/referral to community resources:    PATIENT'S/FAMILY'S RESPONSE TO PLAN OF CARE: Patient is concerned that he is not getting up- "they won't let me get up and the guy hasn't come back to help me". Patient thinks he is at SNF an di have reassured him we will ask staff here to assist him with ambulating. "I can walk".  Wife reports that patient is "impatient" and she appears somewhat frustrated at this time-  " I told him I was [place] if he didn't stop".  Support offered and patient calm and laying in bed.       Reece LevyJanet Miku Udall, MSW, Theresia MajorsLCSWA [phone]

## 2014-08-29 NOTE — Care Management Note (Addendum)
    Page 1 of 2   09/03/2014     5:04:33 PM CARE MANAGEMENT NOTE 09/03/2014  Patient:  Tyler DolphinCARTER,Tyler R   Account Number:  [id number]  Date Initiated:  08/29/2014  Documentation initiated by:  Sharrie RothmanBLACKWELL,TAMMY C  Subjective/Objective Assessment:   Pt admitted from home with [facility] with UTI. Anticipate discharge back to facility.     Action/Plan:   CSW is aware and will arrange discharge when medically stable.   Anticipated DC Date:  09/02/2014   Anticipated DC Plan:  SKILLED NURSING FACILITY  In-house referral  Clinical Social Worker      DC Planning Services  CM consult      Choice offered to / List presented to:             Status of service:  Completed, signed off Medicare Important Message given?  YES (If response is "NO", the following Medicare IM given date fields will be blank) Date Medicare IM given:  09/02/2014 Medicare IM given by:  Kathyrn SheriffHILDRESS,Tyler Date Additional Medicare IM given:   Additional Medicare IM given by:    Discharge Disposition:  HOME W HOSPICE CARE  Per UR Regulation:  Reviewed for med. necessity/level of care/duration of stay  If discussed at Long Length of Stay Meetings, dates discussed:    Comments:  09/03/14 16:00 CM received call from RN who states family wants to have pt discharged home today with hospice.  CM swpoke with wife who states pt has oxygen at home and a hospital bed and would like ambulance transport home.  CM called MD and requested a DME order for suctiioning to clear pt's secretions while at home.  CM spoke with Eber JonesCarolyn [phone] who states she is a home health caretaker and feels comfortable suctoning her father (pt).  [facility] where [place] states she can pick up the suctioning apparatus.  [facility] who requests I fax facesheet, referral, H&P, discharge summary to her at [phone].  Family understands [facility] will be calling them on Monday to arrange an  admission time and they state they understand they will npt have Hospice support until after admission.  CM faxed the requested information.  RN understands pt will go home by ambulance.  No other CM needs were communicated.  Freddy JakschSarah Ulice Moon, BSN, [place]CM [phone].  09/02/2014 1430 Kathyrn SheriffJessica Childress, RN, MSN, PCCN Awaiting full families arrival to discuss Hopsice. If all family members are in agreement for Hospice they will take the pt home with hospice services. Instructions left for nursing staff to consult hospice if decission made after CM has left. Discharge anticipated over weekend. 08/29/14 1100 Tyler Queenammy Blackwell, RN BSN CM

## 2014-08-29 NOTE — Progress Notes (Signed)
81 year old gentleman with h/o systolic heart failure , diabetes Mellitus, hypertension, comes in for possible health care associated pneumonia. He in on oxygen at home at 2 lit/min. His respiratory exam revelaed some diminished air entry at bases.  He was started on IV vancomycin and IV zosyn. His CBG's have been running high and we have increased his SSI to moderate scale. Continue to monitor.   Kathlen ModyVijaya Loni Delbridge, MD [phone]

## 2014-08-29 NOTE — Progress Notes (Signed)
Inpatient Diabetes Program Recommendations  AACE/ADA: New Consensus Statement on Inpatient Glycemic Control (2013)  Target Ranges:  Prepandial:   less than 140 mg/dL      Peak postprandial:   less than 180 mg/dL (1-2 hours)      Critically ill patients:  140 - 180 mg/dL  Results for Karleen DolphinCARTER, Peder R (MRN [id number]) as of 08/29/2014 10:37  Ref. Range 08/29/2014 07:54  Glucose-Capillary Latest Range: 70-99 mg/dL 409399 (H)   Results for Karleen DolphinCARTER, Williard R (MRN [id number]) as of 08/29/2014 10:37  Ref. Range 08/28/2014 16:10 08/29/2014 06:23  Glucose Latest Range: 70-99 mg/dL 956342 (H) 213479 (H)    Diabetes history: DM2 Outpatient Diabetes medications: Lantus 17 units BID, Novolog 0-9 units TID with meals Current orders for Inpatient glycemic control: Lantus 17 units BID, Novolog 0-9 TID with meals, Novolog 0-5 units QHS  Inpatient Diabetes Program Recommendations Insulin - Meal Coverage: Please consider ordering Novolog 4 units TID with meals for meal coverage.  Note: In reviewing the chart, noted patient received one time order of Solumedrol 125 mg on 08/28/14 at 18:15 which is contributing to hyperglycemia.   Thanks, Orlando PennerMarie Resa Rinks, RN, MSN, CCRN, CDE Diabetes Coordinator Inpatient Diabetes Program [phone] (Team Pager) [phone] (AP office) [phone] [facility] office)

## 2014-08-29 NOTE — Progress Notes (Signed)
UR chart review completed.  

## 2014-08-30 ENCOUNTER — Inpatient Hospital Stay (HOSPITAL_COMMUNITY): Payer: Medicare Other

## 2014-08-30 DIAGNOSIS — J189 Pneumonia, unspecified organism: Secondary | ICD-10-CM | POA: Diagnosis present

## 2014-08-30 LAB — BASIC METABOLIC PANEL
Anion gap: 7 (ref 5–15)
BUN: 59 mg/dL — AB (ref 6–23)
CHLORIDE: 96 mmol/L (ref 96–112)
CO2: 32 mmol/L (ref 19–32)
Calcium: 8.7 mg/dL (ref 8.4–10.5)
Creatinine, Ser: 1.92 mg/dL — ABNORMAL HIGH (ref 0.50–1.35)
GFR calc Af Amer: 34 mL/min — ABNORMAL LOW (ref >90)
GFR calc non Af Amer: 29 mL/min — ABNORMAL LOW (ref >90)
GLUCOSE: 287 mg/dL — AB (ref 70–99)
Potassium: 4.1 mmol/L (ref 3.5–5.1)
Sodium: 135 mmol/L (ref 135–145)

## 2014-08-30 LAB — GLUCOSE, CAPILLARY
GLUCOSE-CAPILLARY: 374 mg/dL — AB (ref 70–99)
GLUCOSE-CAPILLARY: 329 mg/dL — AB (ref 70–99)
GLUCOSE-CAPILLARY: 253 mg/dL — AB (ref 70–99)
Glucose-Capillary: 176 mg/dL — ABNORMAL HIGH (ref 70–99)
Glucose-Capillary: 169 mg/dL — ABNORMAL HIGH (ref 70–99)

## 2014-08-30 MED ORDER — INSULIN GLARGINE 100 UNIT/ML ~~LOC~~ SOLN
20.0000 [IU] | Freq: Two times a day (BID) | SUBCUTANEOUS | Status: DC
  Administered 2014-08-30 – 2014-09-03 (×8): 20 [IU] via SUBCUTANEOUS
  Filled 2014-08-30 (×13): qty 0.2

## 2014-08-30 MED ORDER — AZITHROMYCIN 250 MG PO TABS
500.0000 mg | ORAL_TABLET | Freq: Every day | ORAL | Status: DC
Start: 2014-08-30 — End: 2014-09-03
  Administered 2014-08-30 – 2014-09-02 (×4): 500 mg via ORAL
  Filled 2014-08-30 (×5): qty 2

## 2014-08-30 MED ORDER — INSULIN ASPART 100 UNIT/ML ~~LOC~~ SOLN
3.0000 [IU] | Freq: Three times a day (TID) | SUBCUTANEOUS | Status: DC
  Administered 2014-08-30 – 2014-09-03 (×8): 3 [IU] via SUBCUTANEOUS

## 2014-08-30 MED ORDER — FUROSEMIDE 40 MG PO TABS
40.0000 mg | ORAL_TABLET | Freq: Every day | ORAL | Status: DC
  Administered 2014-08-31: 40 mg via ORAL
  Filled 2014-08-30: qty 1

## 2014-08-30 NOTE — Clinical Documentation Improvement (Signed)
Presents with Fever, CHF and possible HCAP.   H/O systolic heart failure  BNP this admission = 615, 715  ECHO resulted 1/31 reveals EF of 35 to 40% with systolic function moderately reduced  Being treated with IV Lasix 40mg  2x's daily  Please clarify the type and acuity of patient's heart failure and document findings in next progress note and include in discharge summary.  Acuity - Acute, Chronic, Acute on Chronic  Type - Systolic, Diastolic, Systolic and Diastolic   Thank You, Shellee MiloEileen T Macai Sisneros ,RN Clinical Documentation Specialist:  [phone]  [facility]- Health Information Management

## 2014-08-30 NOTE — Clinical Documentation Improvement (Signed)
Presents with fever, desaturations (ED note), CHF and possible HCAP.   Tachypneic with rates ranging from 24 to 30 initially  Tachycardic with HR in 90's  O2 saturations dipped to low 90's in ED  Placed on 6L FiO2 and have weaned to 4L  Please provide a diagnosis associated with the above clinical indicators and treatment provided and document findings in next progress note and include in discharge summary if applicable.  Acute Respiratory Failure Acute on Chronic Respiratory Failure Chronic Respiratory Failure Other Condition  Thank You, Shellee MiloEileen T Glorene Leitzke ,RN Clinical Documentation Specialist:  [phone]  [facility]- Health Information Management

## 2014-08-30 NOTE — Progress Notes (Addendum)
Inpatient Diabetes Program Recommendations  AACE/ADA: New Consensus Statement on Inpatient Glycemic Control (2013)  Target Ranges:  Prepandial:   less than 140 mg/dL      Peak postprandial:   less than 180 mg/dL (1-2 hours)      Critically ill patients:  140 - 180 mg/dL    Diabetes history: DM2 Outpatient Diabetes medications: Lantus 17 units BID, Novolog 0-9 units TID with meals Current orders for Inpatient glycemic control: Lantus 17 units BID, Novolog 0-15 TID with meals, Novolog 0-5 units QHS  Inpatient Diabetes Program Recommendations Insulin - Basal: Fasting glucose is 211 mg/dl this morning. Please consider increasing Lantus to 20 units BID. Insulin - Meal Coverage: Please consider ordering Novolog 3 units TID with meals for meal coverage (in addition to Novolog correction scale).  Note: Patient received a total of Novolog 33 units for correction on 08/29/14 and fasting glucose is 211 mg/dl this morning.   Thanks, Orlando PennerMarie Luva Metzger, RN, MSN, CCRN, CDE Diabetes Coordinator Inpatient Diabetes Program [phone] (Team Pager) [phone] (AP office) [phone] [facility](MC office)

## 2014-08-30 NOTE — Progress Notes (Signed)
Glucose on accucheck 211 this morning

## 2014-08-30 NOTE — Progress Notes (Signed)
ANTIBIOTIC CONSULT NOTE  Pharmacy Consult for Vancomycin & Zosyn (also on Zithromax)  Indication: pneumonia,UTI  Allergies  Allergen Reactions  . Cephalexin Other (See Comments)    Unknown   . Ciprofloxacin Other (See Comments)    Unknown    Patient Measurements: Height: 6' (182.9 cm) Weight: 172 lb 13.5 oz (78.4 kg) IBW/kg (Calculated) : 77.6  Vital Signs: Temp: 97.7 F (36.5 C) (02/09 0544) Temp Source: Oral (02/09 0544) BP: 111/60 mmHg (02/09 0544) Pulse Rate: 80 (02/09 0544) Intake/Output from previous day: 02/08 0701 - 02/09 0700 In: 50 [IV Piggyback:50] Out: -  Intake/Output from this shift: Total I/O In: 240 [P.O.:240] Out: 400 [Urine:400]  Labs:  Recent Labs  08/28/14 1610 08/29/14 0623 08/30/14 1058  WBC 9.1 5.7  --   HGB 11.3* 11.1*  --   PLT 169 148*  --   CREATININE 1.61* 1.70* 1.92*   Estimated Creatinine Clearance: 28.6 mL/min (by C-G formula based on Cr of 1.92). No results for input(s): VANCOTROUGH, VANCOPEAK, VANCORANDOM, GENTTROUGH, GENTPEAK, GENTRANDOM, TOBRATROUGH, TOBRAPEAK, TOBRARND, AMIKACINPEAK, AMIKACINTROU, AMIKACIN in the last 72 hours.   Microbiology: Recent Results (from the past 720 hour(s))  Urine culture     Status: None (Preliminary result)   Collection Time: 08/28/14  4:12 PM  Result Value Ref Range Status   Specimen Description URINE, CATHETERIZED  Final   Special Requests NONE  Final   Colony Count PENDING  Incomplete   Culture   Final    Culture reincubated for better growth Performed at [facility]    Report Status PENDING  Incomplete  Culture, blood (routine x 2)     Status: None (Preliminary result)   Collection Time: 08/28/14  4:28 PM  Result Value Ref Range Status   Specimen Description BLOOD RIGHT WRIST  Final   Special Requests BOTTLES DRAWN AEROBIC AND ANAEROBIC 6CC  Final   Culture NO GROWTH 2 DAYS  Final   Report Status PENDING  Incomplete  Culture, blood (routine x 2)     Status: None  (Preliminary result)   Collection Time: 08/28/14  4:35 PM  Result Value Ref Range Status   Specimen Description BLOOD LEFT WRIST  Final   Special Requests BOTTLES DRAWN AEROBIC ONLY 6CC  Final   Culture NO GROWTH 2 DAYS  Final   Report Status PENDING  Incomplete  Culture, expectorated sputum-assessment     Status: None (Preliminary result)   Collection Time: 08/29/14 10:42 AM  Result Value Ref Range Status   Specimen Description SPUTUM EXPECTORATED  Final   Special Requests NONE  Final   Sputum evaluation   Final    THIS SPECIMEN IS ACCEPTABLE. RESPIRATORY CULTURE REPORT TO FOLLOW. Performed at [facility]    Report Status PENDING  Incomplete  MRSA PCR Screening     Status: None   Collection Time: 08/29/14 10:42 AM  Result Value Ref Range Status   MRSA by PCR NEGATIVE NEGATIVE Final    Comment:        The GeneXpert MRSA Assay (FDA approved for NASAL specimens only), is one component of a comprehensive MRSA colonization surveillance program. It is not intended to diagnose MRSA infection nor to guide or monitor treatment for MRSA infections.   Culture, respiratory (NON-Expectorated)     Status: None (Preliminary result)   Collection Time: 08/29/14 10:42 AM  Result Value Ref Range Status   Specimen Description SPUTUM EXPECTORATED  Final   Special Requests NONE  Final   Gram Stain  Final    FEW WBC PRESENT, PREDOMINANTLY PMN FEW SQUAMOUS EPITHELIAL CELLS PRESENT MODERATE GRAM VARIABLE ROD MODERATE GRAM POSITIVE COCCI IN PAIRS IN CHAINS IN CLUSTERS    Culture   Final    Culture reincubated for better growth Performed at [facility]    Report Status PENDING  Incomplete    Anti-infectives    Start     Dose/Rate Route Frequency Ordered Stop   08/30/14 1800  azithromycin (ZITHROMAX) tablet 500 mg     500 mg Oral Daily-1800 08/30/14 1205 09/04/14 1759   08/29/14 1800  azithromycin (ZITHROMAX) 500 mg in dextrose 5 % 250 mL IVPB  Status:  Discontinued      500 mg250 mL/hr over 60 Minutes Intravenous Every 24 hours 08/28/14 2238 08/30/14 1205   08/29/14 1000  piperacillin-tazobactam (ZOSYN) IVPB 3.375 g     3.375 g12.5 mL/hr over 240 Minutes Intravenous Every 8 hours 08/28/14 2238     08/28/14 2300  vancomycin (VANCOCIN) IVPB 1000 mg/200 mL premix     1,000 mg200 mL/hr over 60 Minutes Intravenous Every 24 hours 08/28/14 2238     08/28/14 2230  azithromycin (ZITHROMAX) 500 mg in dextrose 5 % 250 mL IVPB  Status:  Discontinued     500 mg250 mL/hr over 60 Minutes Intravenous Daily at bedtime 08/28/14 2219 08/28/14 2238   08/28/14 1815  levofloxacin (LEVAQUIN) IVPB 500 mg     500 mg100 mL/hr over 60 Minutes Intravenous  Once 08/28/14 1802 08/28/14 1910     Assessment: 81 yo M admitted from NH with fever (Tm 102.14F), decreased O2 sats, and increased confusion.   Empiric antibiotics started for PNA vs UTI.   Chronic renal insufficiency noted- at patient's baseline.  Estimated CrCl ~ 30-535ml/min.  Vancomycin 2/7>> Zosyn 2/8>> Zithromax 2/8 >> Levaquin 2/7>>2/7  Goal of Therapy:  Vancomycin trough level 15-20 mcg/ml  Plan:  Zosyn 3.375gm IV Q8h to be infused over 4hrs Vancomycin 1gm IV q24h Check Vancomycin trough at steady state Deescalate ABX when appropriate. Monitor renal function and cx data   PHARMACIST - PHYSICIAN COMMUNICATION  CONCERNING: Antibiotic IV to Oral Route Change Policy  RECOMMENDATION: This patient is receiving Zithromax by the intravenous route.  Based on criteria approved by the Pharmacy and Therapeutics Committee, the antibiotic(s) is/are being converted to the equivalent oral dose form(s).  DESCRIPTION: These criteria include:  Patient being treated for a respiratory tract infection, urinary tract infection, cellulitis or clostridium difficile associated diarrhea if on metronidazole  The patient is not neutropenic and does not exhibit a GI malabsorption state  The patient is eating (either orally or via tube)  and/or has been taking other orally administered medications for a least 24 hours  The patient is improving clinically and has a Tmax < 100.5  If you have questions about this conversion, please contact the Pharmacy Department  [x]   [phone]( [phone] )  [facility] []   [phone]( [phone] )  [facility]  []   [phone]( [phone] )  [facility] []   [phone]( [phone] )  [facility]   Jentzen Minasyan, Lorin PicketScott A 08/30/2014,12:05 PM

## 2014-08-30 NOTE — Progress Notes (Signed)
[facility] PROGRESS NOTE  Tyler Moon:[id number] DOB: 05/13/1923 DOA: 08/28/2014 PCP: [name], MD Interim summary: 81 year old gentleman with h/o systolic heart failure , diabetes Mellitus, hypertension, comes in for possible health care associated pneumonia and acute on chronic systolic heart failure. He in on oxygen at home at 2 lit/min. Assessment/Plan: 1. Acute on chronic respiratory failure:  Possibly secondary to a combination of health care associated pneumonia and from acute on chronic systolic heart failure.  He was started on broad spectrum antibiotics with IV vancomycin and IV zosyn and IV lasix for the heart failure. He has not much diuresis ,.but his breathing has improved. Since his renal function was worsening , his IV lasix was transitioned to po lasix daily. His blood cultures have been negative and we have stopped IV vancomycin. Plan to transition oral antibiotics.    Diabetes mellitus: CBG (last 3)   Recent Labs  08/29/14 0754 08/30/14 1133  GLUCAP 399* 253*    Increase lantus to 20 units BID and add 3 units of TIDAC of novolog. Continue with SSI.    Hypertension: Better controlled.    Acute on chronic CKD stage 3. : Slight worsening of renal function when compared to baseline. rpeat renal parameters in am.    Code Status: fullc ode.  Family Communication: wife at bedside Disposition Plan: probably back to SNF when ready.    Consultants:  none  Procedures:  none  Antibiotics:  Vancomycin   Zosyn 2/7- 2/9  HPI/Subjective: BREATHING BETTER . Wants to walk.   Objective: Filed Vitals:   08/30/14 1407  BP: 130/69  Pulse: 79  Temp: 97.8 F (36.6 C)  Resp:     Intake/Output Summary (Last 24 hours) at 08/30/14 1450 Last data filed at 08/30/14 1237  Gross per 24 hour  Intake    480 ml  Output    900 ml  Net   -420 ml   Filed Weights   08/28/14 1629 08/28/14 2202 08/30/14 0544  Weight: 86.002 kg (189 lb 9.6 oz)  78.109 kg (172 lb 3.2 oz) 78.4 kg (172 lb 13.5 oz)    Exam:   General:  Alert confused  Cardiovascular: s1s2  Respiratory: diminished air entry at bases, now heezing or rhonchi  Abdomen: soft non tender non distended bowel sounds heard  Musculoskeletal: bilateral BKA.   Data Reviewed: Basic Metabolic Panel:  Recent Labs Lab 08/24/14 0525 08/25/14 0420 08/28/14 1610 08/29/14 0623 08/30/14 1058  NA 139 139 138 138 135  K 4.4 4.4 4.7 4.5 4.1  CL 102 101 104 100 96  CO2 34* 33* 29 30 32  GLUCOSE 206* 188* 342* 479* 287*  BUN 48* 40* 34* 43* 59*  CREATININE 1.80* 1.58* 1.61* 1.70* 1.92*  CALCIUM 8.3* 8.5 8.4 8.8 8.7  MG  --  2.2  --   --   --    Liver Function Tests:  Recent Labs Lab 08/29/14 0623  AST 26  ALT 20  ALKPHOS 58  BILITOT 0.9  PROT 5.9*  ALBUMIN 2.6*   No results for input(s): LIPASE, AMYLASE in the last 168 hours. No results for input(s): AMMONIA in the last 168 hours. CBC:  Recent Labs Lab 08/28/14 1610 08/29/14 0623  WBC 9.1 5.7  NEUTROABS 7.5 5.3  HGB 11.3* 11.1*  HCT 36.3* 36.3*  MCV 99.5 100.3*  PLT 169 148*   Cardiac Enzymes: No results for input(s): CKTOTAL, CKMB, CKMBINDEX, TROPONINI in the last 168 hours. BNP (last 3 results)  Recent Labs  08/25/14 0500 08/28/14 1610 08/28/14 2300  BNP 823.1* 615.0* 715.0*    ProBNP (last 3 results)  Recent Labs  07/06/14 2138  PROBNP 2693.0*    CBG:  Recent Labs Lab 08/25/14 0644 08/25/14 1107 08/25/14 1725 08/29/14 0754 08/30/14 1133  GLUCAP 193* 322* 282* 399* 253*    Recent Results (from the past 240 hour(s))  Urine culture     Status: None (Preliminary result)   Collection Time: 08/28/14  4:12 PM  Result Value Ref Range Status   Specimen Description URINE, CATHETERIZED  Final   Special Requests NONE  Final   Colony Count PENDING  Incomplete   Culture   Final    Culture reincubated for better growth Performed at [facility]    Report Status PENDING   Incomplete  Culture, blood (routine x 2)     Status: None (Preliminary result)   Collection Time: 08/28/14  4:28 PM  Result Value Ref Range Status   Specimen Description BLOOD RIGHT WRIST  Final   Special Requests BOTTLES DRAWN AEROBIC AND ANAEROBIC 6CC  Final   Culture NO GROWTH 2 DAYS  Final   Report Status PENDING  Incomplete  Culture, blood (routine x 2)     Status: None (Preliminary result)   Collection Time: 08/28/14  4:35 PM  Result Value Ref Range Status   Specimen Description BLOOD LEFT WRIST  Final   Special Requests BOTTLES DRAWN AEROBIC ONLY 6CC  Final   Culture NO GROWTH 2 DAYS  Final   Report Status PENDING  Incomplete  Culture, expectorated sputum-assessment     Status: None (Preliminary result)   Collection Time: 08/29/14 10:42 AM  Result Value Ref Range Status   Specimen Description SPUTUM EXPECTORATED  Final   Special Requests NONE  Final   Sputum evaluation   Final    THIS SPECIMEN IS ACCEPTABLE. RESPIRATORY CULTURE REPORT TO FOLLOW. Performed at [facility]    Report Status PENDING  Incomplete  MRSA PCR Screening     Status: None   Collection Time: 08/29/14 10:42 AM  Result Value Ref Range Status   MRSA by PCR NEGATIVE NEGATIVE Final    Comment:        The GeneXpert MRSA Assay (FDA approved for NASAL specimens only), is one component of a comprehensive MRSA colonization surveillance program. It is not intended to diagnose MRSA infection nor to guide or monitor treatment for MRSA infections.   Culture, respiratory (NON-Expectorated)     Status: None (Preliminary result)   Collection Time: 08/29/14 10:42 AM  Result Value Ref Range Status   Specimen Description SPUTUM EXPECTORATED  Final   Special Requests NONE  Final   Gram Stain   Final    FEW WBC PRESENT, PREDOMINANTLY PMN FEW SQUAMOUS EPITHELIAL CELLS PRESENT MODERATE GRAM VARIABLE ROD MODERATE GRAM POSITIVE COCCI IN PAIRS IN CHAINS IN CLUSTERS    Culture   Final    Culture reincubated  for better growth Performed at [facility]    Report Status PENDING  Incomplete     Studies: Ct Head Wo Contrast  08/29/2014   CLINICAL DATA:  Altered mental status, possibly UTI  EXAM: CT HEAD WITHOUT CONTRAST  TECHNIQUE: Contiguous axial images were obtained from the base of the skull through the vertex without intravenous contrast.  COMPARISON:  08/20/2014  FINDINGS: No evidence of parenchymal hemorrhage or extra-axial fluid collection. No mass lesion, mass effect, or midline shift.  No CT evidence of acute infarction.  Encephalomalacic changes related to old right temporoparietal infarct.  Subcortical white matter and periventricular small vessel ischemic changes. Intracranial atherosclerosis.  Global cortical atrophy.  No ventriculomegaly.  The visualized paranasal sinuses are essentially clear. The mastoid air cells are unopacified.  No evidence of calvarial fracture.  IMPRESSION: No evidence of acute intracranial abnormality.  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  Atrophy with small vessel ischemic changes.   Electronically Signed   By: Charline Bills M.D.   On: 08/29/2014 09:08   Dg Chest Portable 1 View  08/28/2014   CLINICAL DATA:  Cough and fever.  EXAM: PORTABLE CHEST - 1 VIEW  COMPARISON:  08/24/2014 and 08/20/2014  FINDINGS: There is chronic cardiomegaly. Moderate left effusion persists. Small right effusion has diminished. Pulmonary edema has improved and the pulmonary vascularity is now normal.  IMPRESSION: Improved pulmonary edema. Resolution of pulmonary vascular congestion. Decreased right effusion. Stable moderate left effusion.   Electronically Signed   By: Geanie Cooley M.D.   On: 08/28/2014 17:03    Scheduled Meds: . albuterol  2.5 mg Nebulization Q6H WA  . azithromycin  500 mg Oral q1800  . cholecalciferol  1,000 Units Oral Daily  . docusate sodium  100 mg Oral QHS  . famotidine  10 mg Oral Daily  . feeding supplement (NEPRO CARB STEADY)  237 mL Oral BID PC  .  [START ON 08/31/2014] furosemide  40 mg Oral Daily  . guaiFENesin  600 mg Oral BID  . hydrALAZINE  25 mg Oral TID  . insulin aspart  0-15 Units Subcutaneous TID WC  . insulin aspart  0-5 Units Subcutaneous QHS  . insulin glargine  17 Units Subcutaneous BID  . isosorbide mononitrate  60 mg Oral Daily  . simvastatin  40 mg Oral QHS  . sodium chloride  3 mL Intravenous Q12H  . sodium chloride  3 mL Intravenous Q12H  . tiZANidine  2 mg Oral BID  . vitamin B-12  1,000 mcg Oral Daily  . vitamin C  500 mg Oral Daily   Continuous Infusions:   Active Problems:   Fever   Altered mental status    Time spent: 25 minutes    Tiffine Henigan  [facility] Pager [phone]  If 7PM-7AM, please contact night-coverage at [email or web address], password [facility] 08/30/2014, 2:50 PM  LOS: 2 days

## 2014-08-31 DIAGNOSIS — R7989 Other specified abnormal findings of blood chemistry: Secondary | ICD-10-CM | POA: Clinically undetermined

## 2014-08-31 DIAGNOSIS — J189 Pneumonia, unspecified organism: Secondary | ICD-10-CM

## 2014-08-31 DIAGNOSIS — I480 Paroxysmal atrial fibrillation: Secondary | ICD-10-CM

## 2014-08-31 DIAGNOSIS — J9620 Acute and chronic respiratory failure, unspecified whether with hypoxia or hypercapnia: Secondary | ICD-10-CM

## 2014-08-31 DIAGNOSIS — E1165 Type 2 diabetes mellitus with hyperglycemia: Secondary | ICD-10-CM

## 2014-08-31 DIAGNOSIS — R778 Other specified abnormalities of plasma proteins: Secondary | ICD-10-CM | POA: Clinically undetermined

## 2014-08-31 DIAGNOSIS — I5023 Acute on chronic systolic (congestive) heart failure: Principal | ICD-10-CM

## 2014-08-31 LAB — CULTURE, RESPIRATORY W GRAM STAIN: Culture: NORMAL

## 2014-08-31 LAB — CULTURE, RESPIRATORY

## 2014-08-31 LAB — GLUCOSE, CAPILLARY
GLUCOSE-CAPILLARY: 312 mg/dL — AB (ref 70–99)
GLUCOSE-CAPILLARY: 143 mg/dL — AB (ref 70–99)
Glucose-Capillary: 211 mg/dL — ABNORMAL HIGH (ref 70–99)
Glucose-Capillary: 146 mg/dL — ABNORMAL HIGH (ref 70–99)
Glucose-Capillary: 224 mg/dL — ABNORMAL HIGH (ref 70–99)
Glucose-Capillary: 132 mg/dL — ABNORMAL HIGH (ref 70–99)
Glucose-Capillary: 151 mg/dL — ABNORMAL HIGH (ref 70–99)

## 2014-08-31 LAB — BASIC METABOLIC PANEL
Anion gap: 10 (ref 5–15)
BUN: 54 mg/dL — AB (ref 6–23)
CHLORIDE: 98 mmol/L (ref 96–112)
CO2: 30 mmol/L (ref 19–32)
CREATININE: 1.8 mg/dL — AB (ref 0.50–1.35)
Calcium: 8.9 mg/dL (ref 8.4–10.5)
GFR calc Af Amer: 37 mL/min — ABNORMAL LOW (ref >90)
GFR calc non Af Amer: 32 mL/min — ABNORMAL LOW (ref >90)
Glucose, Bld: 137 mg/dL — ABNORMAL HIGH (ref 70–99)
Potassium: 3.8 mmol/L (ref 3.5–5.1)
Sodium: 138 mmol/L (ref 135–145)

## 2014-08-31 LAB — TROPONIN I
TROPONIN I: 2.68 ng/mL — AB (ref <0.031)
Troponin I: 4.74 ng/mL (ref <0.031)

## 2014-08-31 LAB — BRAIN NATRIURETIC PEPTIDE: B Natriuretic Peptide: 1055 pg/mL — ABNORMAL HIGH (ref 0.0–100.0)

## 2014-08-31 MED ORDER — ATORVASTATIN CALCIUM 40 MG PO TABS
80.0000 mg | ORAL_TABLET | Freq: Every day | ORAL | Status: DC
  Administered 2014-08-31 – 2014-09-02 (×3): 80 mg via ORAL
  Filled 2014-08-31 (×3): qty 2

## 2014-08-31 MED ORDER — HEPARIN SODIUM (PORCINE) 5000 UNIT/ML IJ SOLN: 5000.0000 [IU] | Freq: Three times a day (TID) | INTRAMUSCULAR | Status: DC

## 2014-08-31 MED ORDER — FUROSEMIDE 10 MG/ML IJ SOLN
40.0000 mg | Freq: Two times a day (BID) | INTRAMUSCULAR | Status: DC
  Administered 2014-08-31 – 2014-09-03 (×6): 40 mg via INTRAVENOUS
  Filled 2014-08-31 (×6): qty 4

## 2014-08-31 MED ORDER — HEPARIN BOLUS VIA INFUSION
3000.0000 [IU] | Freq: Once | INTRAVENOUS | Status: AC
  Administered 2014-08-31: 3000 [IU] via INTRAVENOUS
  Filled 2014-08-31: qty 3000

## 2014-08-31 MED ORDER — HEPARIN (PORCINE) IN NACL 100-0.45 UNIT/ML-% IJ SOLN
1300.0000 [IU]/h | INTRAMUSCULAR | Status: DC
  Administered 2014-08-31: 900 [IU]/h via INTRAVENOUS
  Administered 2014-09-01 – 2014-09-02 (×2): 1300 [IU]/h via INTRAVENOUS
  Filled 2014-08-31 (×3): qty 250

## 2014-08-31 MED ORDER — CARVEDILOL 3.125 MG PO TABS: 3.1250 mg | ORAL_TABLET | Freq: Two times a day (BID) | ORAL | Status: DC

## 2014-08-31 MED ORDER — ASPIRIN 325 MG PO TABS
325.0000 mg | ORAL_TABLET | Freq: Every day | ORAL | Status: DC
  Administered 2014-08-31 – 2014-09-03 (×4): 325 mg via ORAL
  Filled 2014-08-31 (×4): qty 1

## 2014-08-31 NOTE — Progress Notes (Signed)
CRITICAL VALUE ALERT  Critical value received:  Troponin 4.74  Date of notification:  08/31/2014  Time of notification:  2044  Critical value read back:Yes.    Nurse who received alert:  Shelbie Hutchingominique Kathye Cipriani  MD notified (1st page):  Dr. Merdis DelayK. Schorr  Time of first page:  2052  MD notified (2nd page):  Time of second page:  Responding MD:  Dr. Merdis DelayK. Schorr  Time MD responded:  2056

## 2014-08-31 NOTE — Consult Note (Signed)
Primary cardiologist: Consulting cardiologist:  Clinical Summary Tyler Moon is a 81 y.o.male history 1st degree heart block with intermittent 2:1 AV block (reportedly complete heart blocker when on sotalol) , hx of afib, CAD with prior CABG in 1994, DM2, HL, PAD, HTN, carotid stenosis with right CEA, bilateral BKAs, home O2, chronic systolic HF LVEF 16-10% by echo Jan 2016 admitted with altered mental status and SOB on 08/28/14. He has been treated for both acute on chronic HF and possible pneumonia this admit by internal medicine.   Recent admission earlier this month after fall. Seen by EP at that time given conduction disease/trafascicular block, after discussions with family given his age and fraility decision was made for conservative therapy, no pacemaker placement. Mild troponin elevation at that time thought to be demand ischemia.  Cardiology consulted today for elevated troponin at 2.68. Patient denies any chest pain. +SOB, along with productive cough with thick mucous. + subjective fevers/chills.      Echo Jan 2016 LVEF 35-40%, inferior wall akinesis, mild MR Cr 1.8, BUN 54, K 3.8, GFR 32, Hgb 11.1, WBC 5.7, Plt 148 CT head no acute process, old temporoparietal infarct CXR bilateral edema vs infiltrates, left pleural effusion EKG afib old LBBB Trop 2.68   Allergies  Allergen Reactions  . Cephalexin Other (See Comments)    Unknown   . Ciprofloxacin Other (See Comments)    Unknown     Medications Scheduled Medications: . albuterol  2.5 mg Nebulization Q6H WA  . aspirin  325 mg Oral Daily  . azithromycin  500 mg Oral q1800  . carvedilol  3.125 mg Oral BID WC  . cholecalciferol  1,000 Units Oral Daily  . docusate sodium  100 mg Oral QHS  . famotidine  10 mg Oral Daily  . feeding supplement (NEPRO CARB STEADY)  237 mL Oral BID PC  . furosemide  40 mg Intravenous BID  . guaiFENesin  600 mg Oral BID  . hydrALAZINE  25 mg Oral TID  . insulin aspart  0-15 Units  Subcutaneous TID WC  . insulin aspart  0-5 Units Subcutaneous QHS  . insulin aspart  3 Units Subcutaneous TID WC  . insulin glargine  20 Units Subcutaneous BID  . isosorbide mononitrate  60 mg Oral Daily  . simvastatin  40 mg Oral QHS  . sodium chloride  3 mL Intravenous Q12H  . sodium chloride  3 mL Intravenous Q12H  . tiZANidine  2 mg Oral BID  . vitamin B-12  1,000 mcg Oral Daily  . vitamin C  500 mg Oral Daily     Infusions:     PRN Medications:  sodium chloride, acetaminophen **OR** acetaminophen, gabapentin, HYDROcodone-acetaminophen, nitroGLYCERIN, sodium chloride   Past Medical History  Diagnosis Date  . Atrial fibrillation   . Coronary atherosclerosis of native coronary artery     Multivessel  . TIA (transient ischemic attack)   . Anemia   . GI bleed   . Diabetes type 2, controlled   . Hyperlipidemia   . Peripheral arterial disease   . Essential hypertension, benign   . Partial deafness   . MI (myocardial infarction)   . S/P bilateral BKA (below knee amputation)   . Complete heart block     On sotalol  . First degree heart block   . Mitral regurgitation     mild-moderate  . Arthritis   . Neuromuscular disorder     neuropathy  . CHF (congestive heart failure)  Past Surgical History  Procedure Laterality Date  . Coronary artery bypass graft  1994  . Transurethral resection of prostate    . Below knee leg amputation      Bilateral  . Cataract extraction      Family History  Problem Relation Age of Onset  . Heart disease Mother   . Stroke Father     Social History Tyler Moon reports that he has quit smoking. His smoking use included Cigarettes. He has never used smokeless tobacco. Tyler Moon reports that he does not drink alcohol.  Review of Systems CONSTITUTIONAL: No weight loss, fever, chills, weakness or fatigue.  HEENT: Eyes: No visual loss, blurred vision, double vision or yellow sclerae. No hearing loss, sneezing, congestion, runny  nose or sore throat.  SKIN: No rash or itching.  CARDIOVASCULAR: No chest pain, chest pressure or chest discomfort. No palpitations or edema.  RESPIRATORY: +SOB, + cough GASTROINTESTINAL: No anorexia, nausea, vomiting or diarrhea. No abdominal pain or blood.  GENITOURINARY: no polyuria, no dysuria NEUROLOGICAL: No headache, dizziness, syncope, paralysis, ataxia, numbness or tingling in the extremities. No change in bowel or bladder control.  MUSCULOSKELETAL: No muscle, back pain, joint pain or stiffness.  HEMATOLOGIC: No anemia, bleeding or bruising.  LYMPHATICS: No enlarged nodes. No history of splenectomy.  PSYCHIATRIC: No history of depression or anxiety.      Physical Examination Blood pressure 121/67, pulse 78, temperature 98.1 F (36.7 C), temperature source Oral, resp. rate 22, height 6' (1.829 m), weight 170 lb 3.1 oz (77.2 kg), SpO2 97 %.  Intake/Output Summary (Last 24 hours) at 08/31/14 1634 Last data filed at 08/31/14 1313  Gross per 24 hour  Intake   1080 ml  Output   2000 ml  Net   -920 ml    HEENT: sclera clear  Cardiovascular: irreg, no m/r/g, no JVD  Respiratory: coarse bilaterally  GI: abdomen soft, NT, ND  MSK: no LE edema  Neuro: no focal deficits  Psych: appropriate affect   Lab Results  Basic Metabolic Panel:  Recent Labs Lab 08/25/14 0420 08/28/14 1610 08/29/14 0623 08/30/14 1058 08/31/14 0539  NA 139 138 138 135 138  K 4.4 4.7 4.5 4.1 3.8  CL 101 104 100 96 98  CO2 33* 29 30 32 30  GLUCOSE 188* 342* 479* 287* 137*  BUN 40* 34* 43* 59* 54*  CREATININE 1.58* 1.61* 1.70* 1.92* 1.80*  CALCIUM 8.5 8.4 8.8 8.7 8.9  MG 2.2  --   --   --   --     Liver Function Tests:  Recent Labs Lab 08/29/14 0623  AST 26  ALT 20  ALKPHOS 58  BILITOT 0.9  PROT 5.9*  ALBUMIN 2.6*    CBC:  Recent Labs Lab 08/28/14 1610 08/29/14 0623  WBC 9.1 5.7  NEUTROABS 7.5 5.3  HGB 11.3* 11.1*  HCT 36.3* 36.3*  MCV 99.5 100.3*  PLT 169 148*     Cardiac Enzymes:  Recent Labs Lab 08/31/14 1440  TROPONINI 2.68*    BNP: Invalid input(s): POCBNP     Impression/Recommendations  1. Elevated troponin - significant troponin elevation in setting of probable pneumonia, potentially demand ischemia vs ACS.  - patient with known hx of CAD with prior CABG in 1994, do not see recent ischemic eval in chart - no current chest pain, hemodynamically and electrically stable. EKG LBBB cannot interpret ischemia.  - given advanced age, comorbidities, renal dysfunction with GFR 32 would recommend conservative medical therapy. Family recently turned  down invasive procedure with pacemaker placement, and agree they would not want a cath with our discussion today.  - start hep gtt, high dose statin, ASA. -No Beta blocker due to advanced conduction disease, no ACE due to CKD. Will not start plavix as he will be on ASA and ultimately coumadin long term for afib.  - consider IC U transfer for close monitoring.   2. Acute on chronic systolic HF - continue IV lasix - no beta blocker or ACE-I due to conduction disease and renal dyfunction  3. Advanced conduction disease -hx of trifascicular block, seen by EP earlier this month and family not interested in pacemaker - avoid AV nodal agents  4. Afib - normal rates without current therapy - on coumadin at home, appears to have been held on admission.  - we will start heparin in short term with transition to coumadin.   5. Pneumonia -heavy productive cough - abx per primary team.      Dina RichJonathan Branch, M.D.

## 2014-08-31 NOTE — Progress Notes (Signed)
CRITICAL VALUE ALERT  Critical value received:  Troponin 2.68  Date of notification:  08/31/2014   Time of notification:  14:40  Critical value read back:Yes.    Nurse who received alert:   Ian MalkinLaToya Foote  MD notified (1st page):  Janee Mornhompson  Time of first page:  14:41  MD notified (2nd page):  Time of second page:  Responding MD:  Janee Mornhompson  Time MD responded: 14:43, Orders for stat EKG, cardiology consult, heparin drip

## 2014-08-31 NOTE — Evaluation (Signed)
Physical Therapy Evaluation Patient Details Name: Tyler Moon MRN: [id number] DOB: [birth year] Today's Date: 08/31/2014   History of Present Illness  81 yo male with CHF (EF 35-40%), CAD, Dm2, apparently had some question of altered ms, and fever while at SNF. Pt appears to have increased o2 requirement as well. Pt sent to ED for evaluation. Pt was noted to have possible uti, along with CHF on CXR which has improved. Unable to tell if there could be any pneumonia on the LL base. Pt will be admitted for w/up of possible pneumonia/uti. Pt was recently hospitalized after a fall ~1 week ago resulting in a Lt pubic rami fracture and was admitted to a SNF for rehab.   Clinical Impression  Pt is an 81 year old male who presents to PT with dx of fever.  Pt was recently hospitalized ~1 week ago after a fall resulting in a Lt pubic rami fracture; pt was seen by PT at that time as he required max assist for bed mobility skills, assist x2 for transfers, and was only able to side step during gait and was recommended to go to SNF for rehab.  During evaluation today, pt required max assist for bed mobility skills with HOB elevated.  Pt required 1 UE assist and close SBA-> min assist to maintain balance at EOB.  Unable to further assess transfers/gait as prosthetics not in hospital.   Daughter in law present for evaluation and reports pt wife had a fall this morning resulting in lt wrist fracture; unlikely pt wife will be able to provide significant assist with pt secondary to fracture. Recommend continued PT while in the hospital to address strengthening, balance, and activity tolerance for improved functional mobility skills.  Recommended to family present to bring prosthetics to hospital so transfers and gait could be assessed. No DME recommended at this time.      Follow Up Recommendations SNF    Equipment Recommendations  None recommended by PT       Precautions / Restrictions  Precautions Precautions: Fall Precaution Comments: B BKA Restrictions Weight Bearing Restrictions: Yes LLE Weight Bearing: Weight bearing as tolerated      Mobility  Bed Mobility Overal bed mobility: Needs Assistance Bed Mobility: Supine to Sit;Sit to Supine     Supine to sit: Max assist Sit to supine: Max assist   General bed mobility comments: Assist to help legs and trunk support   Transfers                 General transfer comment: Unable to assess as prosthetics not in hospital.      Balance Overall balance assessment: Needs assistance Sitting-balance support: Feet unsupported;Single extremity supported Sitting balance-Leahy Scale: Poor Sitting balance - Comments: Assessed balance at EOB with close SBA->min assist to maintain balance during UE exercises at EOB                                     Pertinent Vitals/Pain Pain Assessment: No/denies pain    Home Living Family/patient expects to be discharged to:: Skilled nursing facility Living Arrangements: Spouse/significant other Available Help at Discharge: Family;Available 24 hours/day Type of Home: House Home Access: Ramped entrance       Home Equipment: Bedside commode;Walker - 4 wheels;Wheelchair - manual;Cane - single point;Tub bench;Hospital bed      Prior Function Level of Independence: Independent with assistive device(s)  Comments: Family reports pt walks with walker with prosthesis     Hand Dominance   Dominant Hand: Right    Extremity/Trunk Assessment               Lower Extremity Assessment: RLE deficits/detail;LLE deficits/detail RLE Deficits / Details: BKA LLE Deficits / Details: BKA     Communication   Communication: HOH (hears best out of Rt ear)  Cognition Arousal/Alertness: Awake/alert Behavior During Therapy: WFL for tasks assessed/performed Overall Cognitive Status: Within Functional Limits for tasks assessed                          Exercises General Exercises - Upper Extremity Shoulder Flexion: AROM;Both;10 reps;Seated Elbow Flexion: AROM;Both;10 reps;Seated (resisted by PT)      Assessment/Plan    PT Assessment Patient needs continued PT services  PT Diagnosis Generalized weakness   PT Problem List Decreased activity tolerance;Decreased balance;Decreased mobility;Decreased knowledge of use of DME;Decreased strength  PT Treatment Interventions Functional mobility training;Therapeutic activities;Therapeutic exercise;Balance training;Patient/family education   PT Goals (Current goals can be found in the Care Plan section) Acute Rehab PT Goals Patient Stated Goal: none stated    Frequency Min 3X/week   Barriers to discharge Decreased caregiver support         End of Session Equipment Utilized During Treatment: Gait belt Activity Tolerance: Patient limited by fatigue Patient left: in bed;with call bell/phone within reach;with bed alarm set;with family/visitor present           Time: 1450-1507 PT Time Calculation (min) (ACUTE ONLY): 17 min   Charges:   PT Evaluation $Initial PT Evaluation Tier I: 1 Procedure     Kellie ShropshireStephanie Jeanclaude Wentworth, DPT [phone]  08/31/2014, 3:23 PM

## 2014-08-31 NOTE — Progress Notes (Signed)
[facility] PROGRESS NOTE  Tyler Moon RUE:[id number] DOB: August 16, 1922 DOA: 08/28/2014 PCP: [name], MD  Assessment/Plan: #1 acute on chronic respiratory failure Likely multifactorial secondary to acute on chronic systolic heart failure exacerbation plus or minus healthcare associated pneumonia. Patient denies any chest pain. Repeat chest x-ray from yesterday shows worsening bilateral edema/infiltrates. I/O equal -1.65 L over the past 24 hours. Patient's current weight is 77.2 kg from 78.4 kg from 86.0 kg on admission. Will change oral Lasix to IV Lasix. Will cycle cardiac enzymes every 6 hours 3. Patient rated recent 2-D echo done 08/21/2014 and as such will not repeat. Patient currently on oral antibiotics will continue for now. Follow.  #2 acute on chronic systolic heart failure Patient presenting with worsening shortness of breath. Chest x-ray consistent with bilateral pulmonary edema. Repeat chest x-ray yesterday showed worsening edema. Patient did have an elevated BNP of 715 on day of admission. Patient will recent 2-D echo done generally 31st 2016 with a EF of 35-40% with akinesia is of the entire inferior myocardium. Right ventricle systolic function was mildly reduced. I/O equal -1.65 L over the past 24 hours. Current weight is 77.2 kg from 86 kg on admission. Change oral Lasix to IV Lasix 40 mg IV every 12 hours. Place on a low-dose beta blocker. Continue hydralazine, imdur. Strict I's and O's. Daily weights. Will cycle cardiac enzymes every 6 hours 3. Check a EKG. Unable to place on ACE inhibitor secondary to renal function. Consult with cardiology for further evaluation and management.  #3 probable healthcare associated pneumonia Patient symptoms are likely mostly secondary to acute on chronic CHF exacerbation. On admission however it was noted per family that patient had a low-grade fever and a productive cough of greenish sputum. Blood cultures with no growth to date.  Respirate cultures with normal oropharyngeal flora. Patient was on empiric IV vancomycin and IV Zosyn. Patient currently on oral azithromycin. Follow.  #4 chronic kidney disease stage III Stable. Follow with diuresis.  #5 hypertension Stable. Continue current regimen of hydralazine, diuretics, imdur. We'll place on low-dose Coreg. Follow.  #6 diabetes mellitus Continue current regimen of Lantus 20 units twice a day, sliding scale insulin.  #7 hyperlipidemia Continue home regimen Zocor.  #8 prophylaxis Pepcid for GI prophylaxis. Heparin for DVT prophylaxis.  Code Status: Full Family Communication: Updated patient, wife, daughter, daughter-in-law at bedside. Disposition Plan: Remain inpatient.   Consultants:  None  Procedures:  Chest x-ray 08/30/2014, 08/28/2014  Antibiotics:  IV Levaquin 08/28/2014>>> 08/28/2014  IV vancomycin 08/28/2014>>>> 08/30/2014  IV Zosyn 08/29/2014>>>> 08/30/2014  IV azithromycin 08/28/2014>>> 08/30/2014  Oral azithromycin 08/30/2014>>>  HPI/Subjective: Patient denies any chest pain. Patient denies any shortness of breath. Patient with no complaints.  Objective: Filed Vitals:   08/31/14 0838  BP: 150/73  Pulse:   Temp:   Resp:     Intake/Output Summary (Last 24 hours) at 08/31/14 1358 Last data filed at 08/31/14 1313  Gross per 24 hour  Intake   1080 ml  Output   2000 ml  Net   -920 ml   Filed Weights   08/28/14 2202 08/30/14 0544 08/31/14 0659  Weight: 78.109 kg (172 lb 3.2 oz) 78.4 kg (172 lb 13.5 oz) 77.2 kg (170 lb 3.1 oz)    Exam:   General:  NAD  Cardiovascular: RRR  Respiratory: Decreased breath sounds in the bases. Some scattered crackles.  Abdomen: Soft, nontender, nondistended, positive bowel sounds.  Musculoskeletal: No clubbing or cyanosis. Status post bilateral BKA.  Data  Reviewed: Basic Metabolic Panel:  Recent Labs Lab 08/25/14 0420 08/28/14 1610 08/29/14 0623 08/30/14 1058 08/31/14 0539   NA 139 138 138 135 138  K 4.4 4.7 4.5 4.1 3.8  CL 101 104 100 96 98  CO2 33* 29 30 32 30  GLUCOSE 188* 342* 479* 287* 137*  BUN 40* 34* 43* 59* 54*  CREATININE 1.58* 1.61* 1.70* 1.92* 1.80*  CALCIUM 8.5 8.4 8.8 8.7 8.9  MG 2.2  --   --   --   --    Liver Function Tests:  Recent Labs Lab 08/29/14 0623  AST 26  ALT 20  ALKPHOS 58  BILITOT 0.9  PROT 5.9*  ALBUMIN 2.6*   No results for input(s): LIPASE, AMYLASE in the last 168 hours. No results for input(s): AMMONIA in the last 168 hours. CBC:  Recent Labs Lab 08/28/14 1610 08/29/14 0623  WBC 9.1 5.7  NEUTROABS 7.5 5.3  HGB 11.3* 11.1*  HCT 36.3* 36.3*  MCV 99.5 100.3*  PLT 169 148*   Cardiac Enzymes: No results for input(s): CKTOTAL, CKMB, CKMBINDEX, TROPONINI in the last 168 hours. BNP (last 3 results)  Recent Labs  08/25/14 0500 08/28/14 1610 08/28/14 2300  BNP 823.1* 615.0* 715.0*    ProBNP (last 3 results)  Recent Labs  07/06/14 2138  PROBNP 2693.0*    CBG:  Recent Labs Lab 08/30/14 1133 08/30/14 1649 08/30/14 2205 08/31/14 0756 08/31/14 1133  GLUCAP 253* 176* 169* 146* 224*    Recent Results (from the past 240 hour(s))  Urine culture     Status: None (Preliminary result)   Collection Time: 08/28/14  4:12 PM  Result Value Ref Range Status   Specimen Description URINE, CATHETERIZED  Final   Special Requests NONE  Final   Colony Count   Final    >=100,000 COLONIES/ML Performed at [facility]    Culture   Final    GRAM NEGATIVE RODS Performed at [facility]    Report Status PENDING  Incomplete  Culture, blood (routine x 2)     Status: None (Preliminary result)   Collection Time: 08/28/14  4:28 PM  Result Value Ref Range Status   Specimen Description BLOOD RIGHT WRIST  Final   Special Requests BOTTLES DRAWN AEROBIC AND ANAEROBIC 6CC  Final   Culture NO GROWTH 3 DAYS  Final   Report Status PENDING  Incomplete  Culture, blood (routine x 2)     Status: None  (Preliminary result)   Collection Time: 08/28/14  4:35 PM  Result Value Ref Range Status   Specimen Description BLOOD LEFT WRIST  Final   Special Requests BOTTLES DRAWN AEROBIC ONLY 6CC  Final   Culture NO GROWTH 3 DAYS  Final   Report Status PENDING  Incomplete  Culture, expectorated sputum-assessment     Status: None (Preliminary result)   Collection Time: 08/29/14 10:42 AM  Result Value Ref Range Status   Specimen Description SPUTUM EXPECTORATED  Final   Special Requests NONE  Final   Sputum evaluation   Final    THIS SPECIMEN IS ACCEPTABLE. RESPIRATORY CULTURE REPORT TO FOLLOW. Performed at [facility]    Report Status PENDING  Incomplete  MRSA PCR Screening     Status: None   Collection Time: 08/29/14 10:42 AM  Result Value Ref Range Status   MRSA by PCR NEGATIVE NEGATIVE Final    Comment:        The GeneXpert MRSA Assay (FDA approved for NASAL specimens only), is  one component of a comprehensive MRSA colonization surveillance program. It is not intended to diagnose MRSA infection nor to guide or monitor treatment for MRSA infections.   Culture, respiratory (NON-Expectorated)     Status: None   Collection Time: 08/29/14 10:42 AM  Result Value Ref Range Status   Specimen Description SPUTUM EXPECTORATED  Final   Special Requests NONE  Final   Gram Stain   Final    FEW WBC PRESENT, PREDOMINANTLY PMN FEW SQUAMOUS EPITHELIAL CELLS PRESENT MODERATE GRAM VARIABLE ROD MODERATE GRAM POSITIVE COCCI IN PAIRS IN CHAINS IN CLUSTERS    Culture   Final    NORMAL OROPHARYNGEAL FLORA Performed at [facility]    Report Status 08/31/2014 FINAL  Final     Studies: Dg Chest Port 1 View  08/30/2014   CLINICAL DATA:  Systolic Heart failure, diabetes, hypertension, shortness of Breath  EXAM: PORTABLE CHEST - 1 VIEW  COMPARISON:  08/28/2014  FINDINGS: Moderate interstitial airspace opacities involving bases more than apices, increased from previous exam. Probable left  pleural effusion as before. Heart size upper limits normal for technique. Previous CABG. Atheromatous aorta.  IMPRESSION: 1. Worsening bilateral edema/infiltrates   Electronically Signed   By: Corlis Leak M.D.   On: 08/30/2014 17:31    Scheduled Meds: . albuterol  2.5 mg Nebulization Q6H WA  . azithromycin  500 mg Oral q1800  . cholecalciferol  1,000 Units Oral Daily  . docusate sodium  100 mg Oral QHS  . famotidine  10 mg Oral Daily  . feeding supplement (NEPRO CARB STEADY)  237 mL Oral BID PC  . furosemide  40 mg Intravenous Q12H  . guaiFENesin  600 mg Oral BID  . hydrALAZINE  25 mg Oral TID  . insulin aspart  0-15 Units Subcutaneous TID WC  . insulin aspart  0-5 Units Subcutaneous QHS  . insulin aspart  3 Units Subcutaneous TID WC  . insulin glargine  20 Units Subcutaneous BID  . isosorbide mononitrate  60 mg Oral Daily  . simvastatin  40 mg Oral QHS  . sodium chloride  3 mL Intravenous Q12H  . sodium chloride  3 mL Intravenous Q12H  . tiZANidine  2 mg Oral BID  . vitamin B-12  1,000 mcg Oral Daily  . vitamin C  500 mg Oral Daily   Continuous Infusions:   Principal Problem:   Acute on chronic respiratory failure Active Problems:   Acute on chronic systolic CHF (congestive heart failure)   HCAP (healthcare-associated pneumonia)   Atrial fibrillation   Diabetes type 2, uncontrolled   S/P bilateral BKA (below knee amputation)   Physical deconditioning   Fever   Altered mental status    Time spent: 40 mins    [facility] MD [facility] Pager [phone]. If 7PM-7AM, please contact night-coverage at [email or web address], password [facility] 08/31/2014, 1:58 PM  LOS: 3 days

## 2014-08-31 NOTE — Progress Notes (Signed)
ANTICOAGULATION CONSULT NOTE - Initial Consult  Pharmacy Consult for Heparin Indication: chest pain/ACS  Allergies  Allergen Reactions  . Cephalexin Other (See Comments)    Unknown   . Ciprofloxacin Other (See Comments)    Unknown     Patient Measurements: Height: 6' (182.9 cm) Weight: 170 lb 3.1 oz (77.2 kg) IBW/kg (Calculated) : 77.6  Vital Signs: Temp: 98.1 F (36.7 C) (02/10 1428) Temp Source: Oral (02/10 1428) BP: 121/67 mmHg (02/10 1428) Pulse Rate: 78 (02/10 1428)  Labs:  Recent Labs  08/29/14 0623 08/30/14 1058 08/31/14 0539 08/31/14 1440  HGB 11.1*  --   --   --   HCT 36.3*  --   --   --   PLT 148*  --   --   --   CREATININE 1.70* 1.92* 1.80*  --   TROPONINI  --   --   --  2.68*    Estimated Creatinine Clearance: 30.4 mL/min (by C-G formula based on Cr of 1.8).   Medical History: Past Medical History  Diagnosis Date  . Atrial fibrillation   . Coronary atherosclerosis of native coronary artery     Multivessel  . TIA (transient ischemic attack)   . Anemia   . GI bleed   . Diabetes type 2, controlled   . Hyperlipidemia   . Peripheral arterial disease   . Essential hypertension, benign   . Partial deafness   . MI (myocardial infarction)   . S/P bilateral BKA (below knee amputation)   . Complete heart block     On sotalol  . First degree heart block   . Mitral regurgitation     mild-moderate  . Arthritis   . Neuromuscular disorder     neuropathy  . CHF (congestive heart failure)     Medications:  Prescriptions prior to admission  Medication Sig Dispense Refill Last Dose  . albuterol (PROVENTIL) (2.5 MG/3ML) 0.083% nebulizer solution Take 2.5 mg by nebulization every 6 (six) hours. For cough and congestion   08/28/2014 at 600a  . cholecalciferol (VITAMIN D) 1000 UNITS tablet Take 1,000 Units by mouth daily.   08/28/2014 at 800a  . docusate sodium (COLACE) 100 MG capsule Take 100 mg by mouth at bedtime.    08/27/2014 at 2000  . furosemide  (LASIX) 40 MG tablet Take 1 tablet (40 mg total) by mouth daily. 30 tablet 0 08/28/2014 at 800a  . gabapentin (NEURONTIN) 600 MG tablet Take 600 mg by mouth daily as needed (for neuropathy. **May only be given at [facility]ime [facility] **[facility] only be [facility]**   08/20/2014  . guaiFENesin (MUCINEX) 600 MG 12 hr tablet Take 600 mg by mouth 2 (two) times daily.   08/28/2014 at 800a  . hydrALAZINE (APRESOLINE) 25 MG tablet Take 1 tablet (25 mg total) by mouth 3 (three) times daily. New medication for high blood pressure treatment. 90 tablet 3 08/28/2014 at 800a  . HYDROcodone-acetaminophen (NORCO/VICODIN) 5-325 MG per tablet Take 1 tablet by mouth every 4 (four) hours as needed for moderate pain. 30 tablet 0 08/26/2014 at Unknown time  . insulin aspart (NOVOLOG) 100 UNIT/ML injection Sliding-scale NovoLog 3 times daily: For blood sugar 70 to 130-no insulin. Blood sugar 131-150-give 3 units insulin. Blood sugar 151-200-give 4 units. Blood sugar 201-250-give 6 units. Blood sugar 231-300-give 9 units. Blood sugar 301-350-give 12 units. Blood sugar 351-400-give 15 units. Blood sugar greater than 400 give 20 units and call your Dr. (Patient taking differently: Inject 3-20  Units into the skin 3 (three) times daily. Per Sliding Scale  If 201-250= 2 units   251-300=  4 units   301-350=  6 units   351-400=  8 units If ove400= 10 units and call MD/NP) 10 mL 11 08/28/2014 at 630a  . insulin glargine (LANTUS) 100 UNIT/ML injection Inject 0.17 mLs (17 Units total) into the skin 2 (two) times daily. 10 mL 11 08/28/2014 at 800a  . isosorbide mononitrate (IMDUR) 60 MG 24 hr tablet Take 60 mg by mouth daily.     08/28/2014 at 800a  . Nutritional Supplements (FEEDING SUPPLEMENT, NEPRO CARB STEADY,) LIQD Take 237 mLs by mouth 2 (two) times daily after a meal. 60 Can 0 08/28/2014 at 800a  . OXYGEN Inhale 2 L into the lungs daily.   08/27/2014 at Unknown time  . ranitidine (ZANTAC) 150 MG tablet Take 150 mg by mouth 2 (two) times  daily.   08/28/2014 at 800a  . simvastatin (ZOCOR) 40 MG tablet Take 40 mg by mouth at bedtime.     08/27/2014 at 2000  . tiZANidine (ZANAFLEX) 4 MG capsule Take 2 mg by mouth 2 (two) times daily.    08/28/2014 at 800a  . vitamin B-12 (CYANOCOBALAMIN) 1000 MCG tablet Take 1,000 mcg by mouth daily.     08/28/2014 at 800a  . warfarin (COUMADIN) 5 MG tablet Take 7.5 mg by mouth every evening.    08/27/2014 at 1700  . nitroGLYCERIN (NITROSTAT) 0.4 MG SL tablet Place 0.4 mg under the tongue every 5 (five) minutes as needed for chest pain.   Never  . vitamin C (ASCORBIC ACID) 500 MG tablet Take 500 mg by mouth daily.     08/20/2014    Assessment: 89 yoM on chronic Coumadin for hx Afib was admitted with fever & increased O2 requirements at [facility].  Cardiology was consulted today for elevated troponin.  Plan to initiate heparin for ACS vs. Demand ischemia with plans to bridge back to Coumadin once work-up complete.  Last Coumadin dose 2/6.  CBC reviewed.  No bleeding noted.   Goal of Therapy:  Heparin level 0.3-0.7 units/ml Monitor platelets by anticoagulation protocol: Yes   Plan:  D/C sq heparin (no doses given) Give 3000 units bolus x 1 Start heparin infusion at 900 units/hr Check anti-Xa level in 8 hours and daily while on heparin Continue to monitor H&H and platelets  Nelia Rogoff, [facility] 08/31/2014,5:48 PM

## 2014-09-01 DIAGNOSIS — J9621 Acute and chronic respiratory failure with hypoxia: Secondary | ICD-10-CM

## 2014-09-01 DIAGNOSIS — I214 Non-ST elevation (NSTEMI) myocardial infarction: Secondary | ICD-10-CM | POA: Clinically undetermined

## 2014-09-01 LAB — BASIC METABOLIC PANEL
Anion gap: 6 (ref 5–15)
BUN: 49 mg/dL — ABNORMAL HIGH (ref 6–23)
CALCIUM: 8.7 mg/dL (ref 8.4–10.5)
CO2: 31 mmol/L (ref 19–32)
Chloride: 104 mmol/L (ref 96–112)
Creatinine, Ser: 1.78 mg/dL — ABNORMAL HIGH (ref 0.50–1.35)
GFR, EST AFRICAN AMERICAN: 37 mL/min — AB (ref >90)
GFR, EST NON AFRICAN AMERICAN: 32 mL/min — AB (ref >90)
Glucose, Bld: 151 mg/dL — ABNORMAL HIGH (ref 70–99)
Potassium: 4 mmol/L (ref 3.5–5.1)
Sodium: 141 mmol/L (ref 135–145)

## 2014-09-01 LAB — CBC
HCT: 35.5 % — ABNORMAL LOW (ref 39.0–52.0)
Hemoglobin: 11.2 g/dL — ABNORMAL LOW (ref 13.0–17.0)
MCH: 30.9 pg (ref 26.0–34.0)
MCHC: 31.5 g/dL (ref 30.0–36.0)
MCV: 97.8 fL (ref 78.0–100.0)
PLATELETS: 180 10*3/uL (ref 150–400)
RBC: 3.63 MIL/uL — ABNORMAL LOW (ref 4.22–5.81)
RDW: 12.5 % (ref 11.5–15.5)
WBC: 4.6 10*3/uL (ref 4.0–10.5)

## 2014-09-01 LAB — URINE CULTURE: Colony Count: >=100000

## 2014-09-01 LAB — GLUCOSE, CAPILLARY
GLUCOSE-CAPILLARY: 108 mg/dL — AB (ref 70–99)
GLUCOSE-CAPILLARY: 197 mg/dL — AB (ref 70–99)
GLUCOSE-CAPILLARY: 237 mg/dL — AB (ref 70–99)

## 2014-09-01 LAB — LIPID PANEL
Cholesterol: 101 mg/dL (ref 0–200)
HDL: 29 mg/dL — ABNORMAL LOW (ref >39)
LDL Cholesterol: 59 mg/dL (ref 0–99)
Total CHOL/HDL Ratio: 3.5 RATIO
Triglycerides: 66 mg/dL (ref <150)
VLDL: 13 mg/dL (ref 0–40)

## 2014-09-01 LAB — HEPARIN LEVEL (UNFRACTIONATED)
Heparin Unfractionated: 0.26 IU/mL — ABNORMAL LOW (ref 0.30–0.70)
Heparin Unfractionated: 0.27 IU/mL — ABNORMAL LOW (ref 0.30–0.70)
Heparin Unfractionated: 0.45 IU/mL (ref 0.30–0.70)

## 2014-09-01 LAB — PROTIME-INR
INR: 1.32 (ref 0.00–1.49)
Prothrombin Time: 16.5 seconds — ABNORMAL HIGH (ref 11.6–15.2)

## 2014-09-01 LAB — TROPONIN I: Troponin I: 5.42 ng/mL (ref <0.031)

## 2014-09-01 LAB — BRAIN NATRIURETIC PEPTIDE: B NATRIURETIC PEPTIDE 5: 1100 pg/mL — AB (ref 0.0–100.0)

## 2014-09-01 MED ORDER — ALBUTEROL SULFATE (2.5 MG/3ML) 0.083% IN NEBU
2.5000 mg | INHALATION_SOLUTION | Freq: Three times a day (TID) | RESPIRATORY_TRACT | Status: DC
  Administered 2014-09-01 – 2014-09-03 (×6): 2.5 mg via RESPIRATORY_TRACT
  Filled 2014-09-01 (×6): qty 3

## 2014-09-01 MED ORDER — GUAIFENESIN ER 600 MG PO TB12
1200.0000 mg | ORAL_TABLET | Freq: Two times a day (BID) | ORAL | Status: DC
  Administered 2014-09-01 – 2014-09-03 (×5): 1200 mg via ORAL
  Filled 2014-09-01 (×5): qty 2

## 2014-09-01 NOTE — Progress Notes (Signed)
Primary Cardiologist: Nona DellMcDowell, Samuel MD  Cardiology Specific Problem List: 1. NSTEMI 2. PAF 3. CAD-Hx of CABG 4. Systolic CHF  Subjective:    Ate breakfast. Still coughing and congested. No frank chest pain.  Objective:   Temp:  [97.4 F (36.3 C)-98.7 F (37.1 C)] 97.4 F (36.3 C) (02/11 0758) Pulse Rate:  [63-93] 75 (02/11 0822) Resp:  [16-28] 26 (02/11 0822) BP: (53-151)/(27-84) 142/73 mmHg (02/11 0800) SpO2:  [82 %-100 %] 97 % (02/11 0822) Weight:  [174 lb 6.1 oz (79.1 kg)] 174 lb 6.1 oz (79.1 kg) (02/11 0500) Last BM Date: 08/31/14  Filed Weights   08/31/14 0659 08/31/14 2119 09/01/14 0500  Weight: 170 lb 3.1 oz (77.2 kg) 174 lb 6.1 oz (79.1 kg) 174 lb 6.1 oz (79.1 kg)    Intake/Output Summary (Last 24 hours) at 09/01/14 0906 Last data filed at 09/01/14 0800  Gross per 24 hour  Intake 1205.25 ml  Output   1075 ml  Net 130.25 ml    Telemetry: SR. LBBB with 1st degree AV block rates in the 70's   Exam:  General: No acute distress.  Lungs: Some inspiratory wheezes in the upper airway. No coughing. Diminished in the bases with poor inspiratory effort.  Cardiac: No elevated JVP or bruits. RRR, no gallop or rub.   Abdomen: Normoactive bowel sounds, nontender, nondistended.  Extremities: No pitting edema, distal pulses full. Bilateral AKA noted.   Lab Results:  Basic Metabolic Panel:  Recent Labs Lab 08/30/14 1058 08/31/14 0539 09/01/14 0221  NA 135 138 141  K 4.1 3.8 4.0  CL 96 98 104  CO2 32 30 31  GLUCOSE 287* 137* 151*  BUN 59* 54* 49*  CREATININE 1.92* 1.80* 1.78*  CALCIUM 8.7 8.9 8.7    CBC:  Recent Labs Lab 08/28/14 1610 08/29/14 0623 09/01/14 0221  WBC 9.1 5.7 4.6  HGB 11.3* 11.1* 11.2*  HCT 36.3* 36.3* 35.5*  MCV 99.5 100.3* 97.8  PLT 169 148* 180    Cardiac Enzymes:  Recent Labs Lab 08/31/14 1440 08/31/14 1942 09/01/14 0221  TROPONINI 2.68* 4.74* 5.42*    BNP:  Recent Labs  07/06/14 2138  PROBNP 2693.0*     Coagulation:  Recent Labs Lab 09/01/14 0221  INR 1.32    Radiology: Dg Chest Port 1 View  08/30/2014   CLINICAL DATA:  Systolic Heart failure, diabetes, hypertension, shortness of Breath  EXAM: PORTABLE CHEST - 1 VIEW  COMPARISON:  08/28/2014  FINDINGS: Moderate interstitial airspace opacities involving bases more than apices, increased from previous exam. Probable left pleural effusion as before. Heart size upper limits normal for technique. Previous CABG. Atheromatous aorta.  IMPRESSION: 1. Worsening bilateral edema/infiltrates   Electronically Signed   By: Corlis Leak  Hassell M.D.   On: 08/30/2014 17:31     Medications:   Scheduled Medications: . albuterol  2.5 mg Nebulization TID  . aspirin  325 mg Oral Daily  . atorvastatin  80 mg Oral q1800  . azithromycin  500 mg Oral q1800  . cholecalciferol  1,000 Units Oral Daily  . docusate sodium  100 mg Oral QHS  . famotidine  10 mg Oral Daily  . feeding supplement (NEPRO CARB STEADY)  237 mL Oral BID PC  . furosemide  40 mg Intravenous BID  . guaiFENesin  1,200 mg Oral BID  . hydrALAZINE  25 mg Oral TID  . insulin aspart  0-15 Units Subcutaneous TID WC  . insulin aspart  0-5 Units Subcutaneous QHS  .  insulin aspart  3 Units Subcutaneous TID WC  . insulin glargine  20 Units Subcutaneous BID  . isosorbide mononitrate  60 mg Oral Daily  . sodium chloride  3 mL Intravenous Q12H  . sodium chloride  3 mL Intravenous Q12H  . tiZANidine  2 mg Oral BID  . vitamin B-12  1,000 mcg Oral Daily  . vitamin C  500 mg Oral Daily    Infusions: . heparin 1,050 Units/hr (09/01/14 0800)    PRN Medications: sodium chloride, acetaminophen **OR** acetaminophen, gabapentin, HYDROcodone-acetaminophen, nitroGLYCERIN, sodium chloride   Assessment and Plan:   1. NSTEMI: Troponin I rising from initial presentation - 2.68, 4.74, 5.42. He remains on heparin gtt. No BB due to heart block. Review of notes from Dr. Wyline Mood yesterday reveal that family does not  want invasive testing, cath. Continue conservative management.  2. CAD with systolic dysfunction:  EF 35%-40% per echo in 07/2014.  Continue IV lasix, Has diuresed 1.6 liters. Creatinine 1.92. Transition to po in am. On lasix 40 mg daily at home. May need a higher dose in the setting of NSTEMI, have not repeated echo. Continue statin and ASA.  3. Atrial fib: CHADS VASC 6. Currently in NSR. Was on coumadin at home. Would transition back as he is not planned for cath.   4. Pneumonia: Antibiotics per PTH.    Bettey Mare. Lawrence NP A[facility]  09/01/2014, 9:06 AM    Attending note:  Patient seen and examined. Agree with above assessment by Ms. Lawrence NP. I reviewed the consultation by Dr. Wyline Mood from yesterday, also hospital course. This morning Mr. Breeden is in no distress, ate some of his breakfast. Still coughing and congested but no chest pain. His lungs exhibit coarse breath sounds with scattered rhonchi, cardiac exam with largely regular rhythm and 2/6 systolic murmur. Cardiac markers are consistent with NSTEMI which we are managing conservatively at this point, and family present concurs. Current regimen includes aspirin, Lipitor, heparin, Lasix, hydralazine, and Imdur. He is not on beta blocker with well-documented history of conduction system disease and transient complete heart block. Follow-up ECG a.m.  Jonelle Sidle, M.D., F.A.C.C.

## 2014-09-01 NOTE — Clinical Social Work Note (Signed)
CSW was notified that pt's wife was requesting to take him home from hospital instead of returning to [place]. Met with husband who defers to wife. CSW left voicemail for her requesting return call.  Benay Pike, [place]

## 2014-09-01 NOTE — Care Management Utilization Note (Signed)
UR completed 

## 2014-09-01 NOTE — Progress Notes (Signed)
Heparin drip increased to 10.125ml/hr per order.

## 2014-09-01 NOTE — Progress Notes (Signed)
ANTICOAGULATION CONSULT NOTE - follow up  Pharmacy Consult for Heparin Indication: chest pain/ACS  Allergies  Allergen Reactions  . Cephalexin Other (See Comments)    Unknown   . Ciprofloxacin Other (See Comments)    Unknown    Patient Measurements: Height: 6' (182.9 cm) Weight: 174 lb 6.1 oz (79.1 kg) IBW/kg (Calculated) : 77.6  Vital Signs: Temp: 97.8 F (36.6 C) (02/11 2000) Temp Source: Oral (02/11 2000) BP: 115/37 mmHg (02/11 2200) Pulse Rate: 65 (02/11 2200)  Labs:  Recent Labs  08/30/14 1058 08/31/14 0539 08/31/14 1440 08/31/14 1942 09/01/14 0221 09/01/14 1426 09/01/14 2049  HGB  --   --   --   --  11.2*  --   --   HCT  --   --   --   --  35.5*  --   --   PLT  --   --   --   --  180  --   --   LABPROT  --   --   --   --  16.5*  --   --   INR  --   --   --   --  1.32  --   --   HEPARINUNFRC  --   --   --   --  0.26* 0.27* 0.45  CREATININE 1.92* 1.80*  --   --  1.78*  --   --   TROPONINI  --   --  2.68* 4.74* 5.42*  --   --    Estimated Creatinine Clearance: 30.9 mL/min (by C-G formula based on Cr of 1.78).  Medical History: Past Medical History  Diagnosis Date  . Atrial fibrillation   . Coronary atherosclerosis of native coronary artery     Multivessel  . TIA (transient ischemic attack)   . Anemia   . GI bleed   . Diabetes type 2, controlled   . Hyperlipidemia   . Peripheral arterial disease   . Essential hypertension, benign   . Partial deafness   . MI (myocardial infarction)   . S/P bilateral BKA (below knee amputation)   . Complete heart block     On sotalol  . First degree heart block   . Mitral regurgitation     mild-moderate  . Arthritis   . Neuromuscular disorder     neuropathy  . CHF (congestive heart failure)    Medications:  Prescriptions prior to admission  Medication Sig Dispense Refill Last Dose  . albuterol (PROVENTIL) (2.5 MG/3ML) 0.083% nebulizer solution Take 2.5 mg by nebulization every 6 (six) hours. For cough and  congestion   08/28/2014 at 600a  . cholecalciferol (VITAMIN D) 1000 UNITS tablet Take 1,000 Units by mouth daily.   08/28/2014 at 800a  . docusate sodium (COLACE) 100 MG capsule Take 100 mg by mouth at bedtime.    08/27/2014 at 2000  . furosemide (LASIX) 40 MG tablet Take 1 tablet (40 mg total) by mouth daily. 30 tablet 0 08/28/2014 at 800a  . gabapentin (NEURONTIN) 600 MG tablet Take 600 mg by mouth daily as needed (for neuropathy. **May only be given at betime per [facility]**). **May only be given at bedtime per [facility]**   08/20/2014  . guaiFENesin (MUCINEX) 600 MG 12 hr tablet Take 600 mg by mouth 2 (two) times daily.   08/28/2014 at 800a  . hydrALAZINE (APRESOLINE) 25 MG tablet Take 1 tablet (25 mg total) by mouth 3 (three) times daily. New medication for high blood pressure treatment. 90  tablet 3 08/28/2014 at 800a  . HYDROcodone-acetaminophen (NORCO/VICODIN) 5-325 MG per tablet Take 1 tablet by mouth every 4 (four) hours as needed for moderate pain. 30 tablet 0 08/26/2014 at Unknown time  . insulin aspart (NOVOLOG) 100 UNIT/ML injection Sliding-scale NovoLog 3 times daily: For blood sugar 70 to 130-no insulin. Blood sugar 131-150-give 3 units insulin. Blood sugar 151-200-give 4 units. Blood sugar 201-250-give 6 units. Blood sugar 231-300-give 9 units. Blood sugar 301-350-give 12 units. Blood sugar 351-400-give 15 units. Blood sugar greater than 400 give 20 units and call your Dr. (Patient taking differently: Inject 3-20 Units into the skin 3 (three) times daily. Per Sliding Scale  If 201-250= 2 units   251-300=  4 units   301-350=  6 units   351-400=  8 units If ove400= 10 units and call MD/NP) 10 mL 11 08/28/2014 at 630a  . insulin glargine (LANTUS) 100 UNIT/ML injection Inject 0.17 mLs (17 Units total) into the skin 2 (two) times daily. 10 mL 11 08/28/2014 at 800a  . isosorbide mononitrate (IMDUR) 60 MG 24 hr tablet Take 60 mg by mouth daily.     08/28/2014 at 800a  . Nutritional Supplements (FEEDING SUPPLEMENT, NEPRO  CARB STEADY,) LIQD Take 237 mLs by mouth 2 (two) times daily after a meal. 60 Can 0 08/28/2014 at 800a  . OXYGEN Inhale 2 L into the lungs daily.   08/27/2014 at Unknown time  . ranitidine (ZANTAC) 150 MG tablet Take 150 mg by mouth 2 (two) times daily.   08/28/2014 at 800a  . simvastatin (ZOCOR) 40 MG tablet Take 40 mg by mouth at bedtime.     08/27/2014 at 2000  . tiZANidine (ZANAFLEX) 4 MG capsule Take 2 mg by mouth 2 (two) times daily.    08/28/2014 at 800a  . vitamin B-12 (CYANOCOBALAMIN) 1000 MCG tablet Take 1,000 mcg by mouth daily.     08/28/2014 at 800a  . warfarin (COUMADIN) 5 MG tablet Take 7.5 mg by mouth every evening.    08/27/2014 at 1700  . nitroGLYCERIN (NITROSTAT) 0.4 MG SL tablet Place 0.4 mg under the tongue every 5 (five) minutes as needed for chest pain.   Never  . vitamin C (ASCORBIC ACID) 500 MG tablet Take 500 mg by mouth daily.     08/20/2014   Assessment: 89 yoM on chronic Coumadin for hx Afib [facility]  Cardiology was consulted today for elevated troponin.  Plan to initiate heparin for ACS vs. Demand ischemia with plans to bridge back to Coumadin once work-up complete.  Last Coumadin dose 2/6.  CBC reviewed.  No bleeding noted.  Heparin level is therapeutic at this time  Goal of Therapy:  Heparin level 0.3-0.7 units/ml Monitor platelets by anticoagulation protocol: Yes   Plan:  Continue Heparin infusion at 1300 units/hr  CBC and HL daily while on Heparin  Margo AyeHall, Saifullah Jolley A 09/01/2014,10:12 PM

## 2014-09-01 NOTE — Progress Notes (Signed)
ANTICOAGULATION CONSULT NOTE - follow up  Pharmacy Consult for Heparin Indication: chest pain/ACS  Allergies  Allergen Reactions  . Cephalexin Other (See Comments)    Unknown   . Ciprofloxacin Other (See Comments)    Unknown    Patient Measurements: Height: 6' (182.9 cm) Weight: 174 lb 6.1 oz (79.1 kg) IBW/kg (Calculated) : 77.6  Vital Signs: Temp: 97.7 F (36.5 C) (02/11 1200) Temp Source: Axillary (02/11 1200) BP: 117/61 mmHg (02/11 1551) Pulse Rate: 70 (02/11 1300)  Labs:  Recent Labs  08/30/14 1058 08/31/14 0539 08/31/14 1440 08/31/14 1942 09/01/14 0221 09/01/14 1426  HGB  --   --   --   --  11.2*  --   HCT  --   --   --   --  35.5*  --   PLT  --   --   --   --  180  --   LABPROT  --   --   --   --  16.5*  --   INR  --   --   --   --  1.32  --   HEPARINUNFRC  --   --   --   --  0.26* 0.27*  CREATININE 1.92* 1.80*  --   --  1.78*  --   TROPONINI  --   --  2.68* 4.74* 5.42*  --    Estimated Creatinine Clearance: 30.9 mL/min (by C-G formula based on Cr of 1.78).  Medical History: Past Medical History  Diagnosis Date  . Atrial fibrillation   . Coronary atherosclerosis of native coronary artery     Multivessel  . TIA (transient ischemic attack)   . Anemia   . GI bleed   . Diabetes type 2, controlled   . Hyperlipidemia   . Peripheral arterial disease   . Essential hypertension, benign   . Partial deafness   . MI (myocardial infarction)   . S/P bilateral BKA (below knee amputation)   . Complete heart block     On sotalol  . First degree heart block   . Mitral regurgitation     mild-moderate  . Arthritis   . Neuromuscular disorder     neuropathy  . CHF (congestive heart failure)    Medications:  Prescriptions prior to admission  Medication Sig Dispense Refill Last Dose  . albuterol (PROVENTIL) (2.5 MG/3ML) 0.083% nebulizer solution Take 2.5 mg by nebulization every 6 (six) hours. For cough and congestion   08/28/2014 at 600a  . cholecalciferol  (VITAMIN D) 1000 UNITS tablet Take 1,000 Units by mouth daily.   08/28/2014 at 800a  . docusate sodium (COLACE) 100 MG capsule Take 100 mg by mouth at bedtime.    08/27/2014 at 2000  . furosemide (LASIX) 40 MG tablet Take 1 tablet (40 mg total) by mouth daily. 30 tablet 0 08/28/2014 at 800a  . gabapentin (NEURONTIN) 600 MG tablet Take 600 mg by mouth daily as needed (for neuropathy. **[facility] only be [facility] **[facility] only be [facility]**   08/20/2014  . guaiFENesin (MUCINEX) 600 MG 12 hr tablet Take 600 mg by mouth 2 (two) times daily.   08/28/2014 at 800a  . hydrALAZINE (APRESOLINE) 25 MG tablet Take 1 tablet (25 mg total) by mouth 3 (three) times daily. New medication for high blood pressure treatment. 90 tablet 3 08/28/2014 at 800a  . HYDROcodone-acetaminophen (NORCO/VICODIN) 5-325 MG per tablet Take 1 tablet by mouth every 4 (four) hours as needed  for moderate pain. 30 tablet 0 08/26/2014 at Unknown time  . insulin aspart (NOVOLOG) 100 UNIT/ML injection Sliding-scale NovoLog 3 times daily: For blood sugar 70 to 130-no insulin. Blood sugar 131-150-give 3 units insulin. Blood sugar 151-200-give 4 units. Blood sugar 201-250-give 6 units. Blood sugar 231-300-give 9 units. Blood sugar 301-350-give 12 units. Blood sugar 351-400-give 15 units. Blood sugar greater than 400 give 20 units and call your Dr. (Patient taking differently: Inject 3-20 Units into the skin 3 (three) times daily. Per Sliding Scale  If 201-250= 2 units   251-300=  4 units   301-350=  6 units   351-400=  8 units If ove400= 10 units and call MD/NP) 10 mL 11 08/28/2014 at 630a  . insulin glargine (LANTUS) 100 UNIT/ML injection Inject 0.17 mLs (17 Units total) into the skin 2 (two) times daily. 10 mL 11 08/28/2014 at 800a  . isosorbide mononitrate (IMDUR) 60 MG 24 hr tablet Take 60 mg by mouth daily.     08/28/2014 at 800a  . Nutritional Supplements (FEEDING SUPPLEMENT, NEPRO CARB STEADY,) LIQD Take 237 mLs by mouth 2 (two)  times daily after a meal. 60 Can 0 08/28/2014 at 800a  . OXYGEN Inhale 2 L into the lungs daily.   08/27/2014 at Unknown time  . ranitidine (ZANTAC) 150 MG tablet Take 150 mg by mouth 2 (two) times daily.   08/28/2014 at 800a  . simvastatin (ZOCOR) 40 MG tablet Take 40 mg by mouth at bedtime.     08/27/2014 at 2000  . tiZANidine (ZANAFLEX) 4 MG capsule Take 2 mg by mouth 2 (two) times daily.    08/28/2014 at 800a  . vitamin B-12 (CYANOCOBALAMIN) 1000 MCG tablet Take 1,000 mcg by mouth daily.     08/28/2014 at 800a  . warfarin (COUMADIN) 5 MG tablet Take 7.5 mg by mouth every evening.    08/27/2014 at 1700  . nitroGLYCERIN (NITROSTAT) 0.4 MG SL tablet Place 0.4 mg under the tongue every 5 (five) minutes as needed for chest pain.   Never  . vitamin C (ASCORBIC ACID) 500 MG tablet Take 500 mg by mouth daily.     08/20/2014   Assessment: 89 yoM on chronic Coumadin for hx Afib was admitted with fever & increased O2 requirements at [facility].  Cardiology was consulted today for elevated troponin.  Plan to initiate heparin for ACS vs. Demand ischemia with plans to bridge back to Coumadin once work-up complete.  Last Coumadin dose 2/6.  CBC reviewed.  No bleeding noted.  Heparin level is below goal.  Goal of Therapy:  Heparin level 0.3-0.7 units/ml Monitor platelets by anticoagulation protocol: Yes   Plan:  Increase Heparin infusion to 1300 units/hr (currently 1050 units/hr) Recheck Heparin level in ~6 hrs CBC and HL daily while on Heparin  Valrie Hart A 09/01/2014,4:29 PM

## 2014-09-01 NOTE — Progress Notes (Signed)
[facility] PROGRESS NOTE  Tyler DolphinHoward R Moon NWG:[id number] DOB: 08/23/1922 DOA: 08/28/2014 PCP: Ernestine ConradBLUTH, KIRK, MD  Assessment/Plan: NSTEMI -Continue heparin drip. -Plan for conservative management given age and comorbidities. -Cardiology following.  ?HCAP -Was on vanc/zosyn and has been narrowed to azithro which will be continued for 5 days total.  CKD Stage III -At baseline  HTN -Controlled.  Acute on Chronic Hypoxemic Respiratory Failure -Likely related to ongoing cardiac >pulmonary issues. -Continue lasix. -Oxygen as needed.  Acute on Chronic Systolic CHF -Continue lasix. -2L negative since admission.  DM -Fair control  Code Status: Remains full code for now. Family Communication: Multiple family members at bedside updated on poor prognosis. Will request palliative care consult for GOC.  Disposition Plan: To be determined   Consultants:  Cardiology   Antibiotics:  Azithro   Subjective: Drowsy  Objective: Filed Vitals:   09/01/14 1551 09/01/14 1600 09/01/14 1700 09/01/14 1712  BP: 117/61 123/66 132/67   Pulse:  67 70   Temp:    97.7 F (36.5 C)  TempSrc:    Oral  Resp:  22 24   Height:      Weight:      SpO2:  100% 100%     Intake/Output Summary (Last 24 hours) at 09/01/14 1813 Last data filed at 09/01/14 1700  Gross per 24 hour  Intake 221.13 ml  Output   1200 ml  Net -978.87 ml   Filed Weights   08/31/14 0659 08/31/14 2119 09/01/14 0500  Weight: 77.2 kg (170 lb 3.1 oz) 79.1 kg (174 lb 6.1 oz) 79.1 kg (174 lb 6.1 oz)    Exam:   General:  drowsy  Cardiovascular: RRR, no JVD  Respiratory: Poor inspiratory effort, mild crackles  Abdomen: S/NT/ND/+BS  Extremities: bilateral AKA   Data Reviewed: Basic Metabolic Panel:  Recent Labs Lab 08/28/14 1610 08/29/14 0623 08/30/14 1058 08/31/14 0539 09/01/14 0221  NA 138 138 135 138 141  K 4.7 4.5 4.1 3.8 4.0  CL 104 100 96 98 104  CO2 29 30 32 30 31  GLUCOSE 342* 479*  287* 137* 151*  BUN 34* 43* 59* 54* 49*  CREATININE 1.61* 1.70* 1.92* 1.80* 1.78*  CALCIUM 8.4 8.8 8.7 8.9 8.7   Liver Function Tests:  Recent Labs Lab 08/29/14 0623  AST 26  ALT 20  ALKPHOS 58  BILITOT 0.9  PROT 5.9*  ALBUMIN 2.6*   No results for input(s): LIPASE, AMYLASE in the last 168 hours. No results for input(s): AMMONIA in the last 168 hours. CBC:  Recent Labs Lab 08/28/14 1610 08/29/14 0623 09/01/14 0221  WBC 9.1 5.7 4.6  NEUTROABS 7.5 5.3  --   HGB 11.3* 11.1* 11.2*  HCT 36.3* 36.3* 35.5*  MCV 99.5 100.3* 97.8  PLT 169 148* 180   Cardiac Enzymes:  Recent Labs Lab 08/31/14 1440 08/31/14 1942 09/01/14 0221  TROPONINI 2.68* 4.74* 5.42*   BNP (last 3 results)  Recent Labs  08/28/14 2300 08/31/14 1942 09/01/14 0221  BNP 715.0* 1055.0* 1100.0*    ProBNP (last 3 results)  Recent Labs  07/06/14 2138  PROBNP 2693.0*    CBG:  Recent Labs Lab 08/31/14 2040 08/31/14 2202 09/01/14 0738 09/01/14 1111 09/01/14 1555  GLUCAP 151* 143* 108* 197* 237*    Recent Results (from the past 240 hour(s))  Urine culture     Status: None   Collection Time: 08/28/14  4:12 PM  Result Value Ref Range Status   Specimen Description URINE, CATHETERIZED  Final  Special Requests NONE  Final   Colony Count   Final    >=100,000 COLONIES/ML Performed at [facility]    Culture   Final    SERRATIA MARCESCENS KLEBSIELLA ORNITHINOLYTICA Performed at [facility]    Report Status 09/01/2014 FINAL  Final   Organism ID, Bacteria SERRATIA MARCESCENS  Final   Organism ID, Bacteria KLEBSIELLA ORNITHINOLYTICA  Final      Susceptibility   Serratia marcescens - MIC*    CEFAZOLIN >=64 RESISTANT Resistant     CEFTRIAXONE <=1 SENSITIVE Sensitive     CIPROFLOXACIN <=0.25 SENSITIVE Sensitive     GENTAMICIN <=1 SENSITIVE Sensitive     LEVOFLOXACIN <=0.12 SENSITIVE Sensitive     NITROFURANTOIN 128 RESISTANT Resistant     TOBRAMYCIN <=1 SENSITIVE  Sensitive     TRIMETH/SULFA <=20 SENSITIVE Sensitive     * SERRATIA MARCESCENS   Klebsiella ornithinolytica - MIC*    AMPICILLIN >=32 RESISTANT Resistant     CEFAZOLIN >=64 RESISTANT Resistant     CEFTRIAXONE <=1 SENSITIVE Sensitive     CIPROFLOXACIN <=0.25 SENSITIVE Sensitive     GENTAMICIN <=1 SENSITIVE Sensitive     NITROFURANTOIN 64 INTERMEDIATE Intermediate     TOBRAMYCIN <=1 SENSITIVE Sensitive     TRIMETH/SULFA >=320 RESISTANT Resistant     PIP/TAZO <=4 SENSITIVE Sensitive     * KLEBSIELLA ORNITHINOLYTICA  Culture, blood (routine x 2)     Status: None (Preliminary result)   Collection Time: 08/28/14  4:28 PM  Result Value Ref Range Status   Specimen Description BLOOD RIGHT WRIST  Final   Special Requests BOTTLES DRAWN AEROBIC AND ANAEROBIC 6CC  Final   Culture NO GROWTH 4 DAYS  Final   Report Status PENDING  Incomplete  Culture, blood (routine x 2)     Status: None (Preliminary result)   Collection Time: 08/28/14  4:35 PM  Result Value Ref Range Status   Specimen Description BLOOD LEFT WRIST  Final   Special Requests BOTTLES DRAWN AEROBIC ONLY 6CC  Final   Culture NO GROWTH 4 DAYS  Final   Report Status PENDING  Incomplete  Culture, expectorated sputum-assessment     Status: None (Preliminary result)   Collection Time: 08/29/14 10:42 AM  Result Value Ref Range Status   Specimen Description SPUTUM EXPECTORATED  Final   Special Requests NONE  Final   Sputum evaluation   Final    THIS SPECIMEN IS ACCEPTABLE. RESPIRATORY CULTURE REPORT TO FOLLOW. Performed at [facility]    Report Status PENDING  Incomplete  MRSA PCR Screening     Status: None   Collection Time: 08/29/14 10:42 AM  Result Value Ref Range Status   MRSA by PCR NEGATIVE NEGATIVE Final    Comment:        The GeneXpert MRSA Assay (FDA approved for NASAL specimens only), is one component of a comprehensive MRSA colonization surveillance program. It is not intended to diagnose MRSA infection nor to  guide or monitor treatment for MRSA infections.   Culture, respiratory (NON-Expectorated)     Status: None   Collection Time: 08/29/14 10:42 AM  Result Value Ref Range Status   Specimen Description SPUTUM EXPECTORATED  Final   Special Requests NONE  Final   Gram Stain   Final    FEW WBC PRESENT, PREDOMINANTLY PMN FEW SQUAMOUS EPITHELIAL CELLS PRESENT MODERATE GRAM VARIABLE ROD MODERATE GRAM POSITIVE COCCI IN PAIRS IN CHAINS IN CLUSTERS    Culture   Final    NORMAL  OROPHARYNGEAL FLORA Performed at [facility]    Report Status 08/31/2014 FINAL  Final     Studies: No results found.  Scheduled Meds: . albuterol  2.5 mg Nebulization TID  . aspirin  325 mg Oral Daily  . atorvastatin  80 mg Oral q1800  . azithromycin  500 mg Oral q1800  . cholecalciferol  1,000 Units Oral Daily  . docusate sodium  100 mg Oral QHS  . famotidine  10 mg Oral Daily  . feeding supplement (NEPRO CARB STEADY)  237 mL Oral BID PC  . furosemide  40 mg Intravenous BID  . guaiFENesin  1,200 mg Oral BID  . hydrALAZINE  25 mg Oral TID  . insulin aspart  0-15 Units Subcutaneous TID WC  . insulin aspart  0-5 Units Subcutaneous QHS  . insulin aspart  3 Units Subcutaneous TID WC  . insulin glargine  20 Units Subcutaneous BID  . isosorbide mononitrate  60 mg Oral Daily  . sodium chloride  3 mL Intravenous Q12H  . sodium chloride  3 mL Intravenous Q12H  . tiZANidine  2 mg Oral BID  . vitamin B-12  1,000 mcg Oral Daily  . vitamin C  500 mg Oral Daily   Continuous Infusions: . heparin 1,300 Units/hr (09/01/14 1713)    Principal Problem:   Acute on chronic respiratory failure Active Problems:   Atrial fibrillation   Diabetes type 2, uncontrolled   S/P bilateral BKA (below knee amputation)   Physical deconditioning   Acute on chronic systolic CHF (congestive heart failure)   Fever   Altered mental status   HCAP (healthcare-associated pneumonia)   Elevated troponin   NSTEMI (non-ST elevated  myocardial infarction)    Time spent: 25 minutes. Greater than 50% of this time was spent in direct contact with the patient coordinating care.    Chaya Jan  [facility] Pager [phone]  If 7PM-7AM, please contact night-coverage at [email or web address], password [facility] 09/01/2014, 6:13 PM  LOS: 4 days

## 2014-09-01 NOTE — Progress Notes (Signed)
Pt had troponin of 5.42. Troponin's are consistently trending up. Pt is asymptomatic at this time. MD notified. Will continue to monitor.

## 2014-09-01 NOTE — Progress Notes (Signed)
PT Cancellation Note  Patient Details Name: Tyler DolphinHoward R Rupert MRN: [id number] DOB: 07/26/1922   Cancelled Treatment:    Reason Eval/Treat Not Completed: Medical issues which prohibited therapy  Reviewed chart and noted by transferred to ICU after critical troponin values.  Pt to be d/c from acute PT services at this time.  Will need new referral to re-start PT when medically stable.     Kellie ShropshireStephanie Krishang Reading, DPT [phone]  09/01/2014, 3:43 PM

## 2014-09-02 LAB — CBC
HCT: 35.7 % — ABNORMAL LOW (ref 39.0–52.0)
Hemoglobin: 11.2 g/dL — ABNORMAL LOW (ref 13.0–17.0)
MCH: 30.8 pg (ref 26.0–34.0)
MCHC: 31.4 g/dL (ref 30.0–36.0)
MCV: 98.1 fL (ref 78.0–100.0)
Platelets: 196 10*3/uL (ref 150–400)
RBC: 3.64 MIL/uL — ABNORMAL LOW (ref 4.22–5.81)
RDW: 12.5 % (ref 11.5–15.5)
WBC: 5.3 10*3/uL (ref 4.0–10.5)

## 2014-09-02 LAB — BASIC METABOLIC PANEL
ANION GAP: 6 (ref 5–15)
BUN: 40 mg/dL — AB (ref 6–23)
CO2: 32 mmol/L (ref 19–32)
Calcium: 8.6 mg/dL (ref 8.4–10.5)
Chloride: 101 mmol/L (ref 96–112)
Creatinine, Ser: 1.58 mg/dL — ABNORMAL HIGH (ref 0.50–1.35)
GFR calc non Af Amer: 37 mL/min — ABNORMAL LOW (ref >90)
GFR, EST AFRICAN AMERICAN: 43 mL/min — AB (ref >90)
Glucose, Bld: 119 mg/dL — ABNORMAL HIGH (ref 70–99)
POTASSIUM: 4.2 mmol/L (ref 3.5–5.1)
Sodium: 139 mmol/L (ref 135–145)

## 2014-09-02 LAB — CULTURE, BLOOD (ROUTINE X 2)
Culture: NO GROWTH
Culture: NO GROWTH

## 2014-09-02 LAB — HEPARIN LEVEL (UNFRACTIONATED): Heparin Unfractionated: 0.68 IU/mL (ref 0.30–0.70)

## 2014-09-02 NOTE — Progress Notes (Signed)
[facility] PROGRESS NOTE  Tyler Moon ZOX:[id number] DOB: [birth year] DOA: 08/28/2014 PCP: [name], MD  Assessment/Plan: NSTEMI -Continue heparin drip. -Plan for conservative management given age and comorbidities. -Cardiology following.  ?HCAP -Was on vanc/zosyn and has been narrowed to azithro which will be continued for 5 days total.  CKD Stage III -At baseline  HTN -Controlled.  Acute on Chronic Hypoxemic Respiratory Failure -Likely related to ongoing cardiac >pulmonary issues. -Continue lasix. -Oxygen as needed.  Acute on Chronic Systolic CHF -Continue lasix. -2L negative since admission.  DM -Fair control  Code Status: DNR as discussed with daughter at bedside. Family Communication: Patient wants to go home. Discussed home hospice with patient and daughter and they are leaning towards this instead of back to SNF for rehab. Daughter will discuss with other family and hopefully make a decision by this afternoon. Disposition Plan: To be determined   Consultants:  Cardiology   Antibiotics:  Azithro   Subjective: No complaints other than he wants to go home.  Objective: Filed Vitals:   09/02/14 1000 09/02/14 1015 09/02/14 1030 09/02/14 1045  BP: 97/50  Pulse: 81 78 78 72  Temp:      TempSrc:      Resp: Height:      Weight:      SpO2: 96% 93% 94% 94%    Intake/Output Summary (Last 24 hours) at 09/02/14 1054 Last data filed at 09/02/14 1000  Gross per 24 hour  Intake 535.88 ml  Output    800 ml  Net -264.12 ml   Filed Weights   08/31/14 2119 09/01/14 0500 09/02/14 0500  Weight: 79.1 kg (174 lb 6.1 oz) 79.1 kg (174 lb 6.1 oz) 79 kg (174 lb 2.6 oz)    Exam:   General:  awake  Cardiovascular: RRR, no JVD  Respiratory: Poor inspiratory effort, mild crackles  Abdomen: S/NT/ND/+BS  Extremities: bilateral BKAs  Data Reviewed: Basic Metabolic Panel:  Recent Labs Lab 08/29/14 0623  08/30/14 1058 08/31/14 0539 09/01/14 0221 09/02/14 0523  NA 138 135 138 141 139  K 4.5 4.1 3.8 4.0 4.2  CL 100 96 98 104 101  CO2 30 32 30 31 32  GLUCOSE 479* 287* 137* 151* 119*  BUN 43* 59* 54* 49* 40*  CREATININE 1.70* 1.92* 1.80* 1.78* 1.58*  CALCIUM 8.8 8.7 8.9 8.7 8.6   Liver Function Tests:  Recent Labs Lab 08/29/14 0623  AST 26  ALT 20  ALKPHOS 58  BILITOT 0.9  PROT 5.9*  ALBUMIN 2.6*   No results for input(s): LIPASE, AMYLASE in the last 168 hours. No results for input(s): AMMONIA in the last 168 hours. CBC:  Recent Labs Lab 08/28/14 1610 08/29/14 0623 09/01/14 0221 09/02/14 0523  WBC 9.1 5.7 4.6 5.3  NEUTROABS 7.5 5.3  --   --   HGB 11.3* 11.1* 11.2* 11.2*  HCT 36.3* 36.3* 35.5* 35.7*  MCV 99.5 100.3* 97.8 98.1  PLT 169 148* 180 196   Cardiac Enzymes:  Recent Labs Lab 08/31/14 1440 08/31/14 1942 09/01/14 0221  TROPONINI 2.68* 4.74* 5.42*   BNP (last 3 results)  Recent Labs  08/28/14 2300 08/31/14 1942 09/01/14 0221  BNP 715.0* 1055.0* 1100.0*    ProBNP (last 3 results)  Recent Labs  07/06/14 2138  PROBNP 2693.0*    CBG:  Recent Labs Lab 08/31/14 2040 08/31/14 2202 09/01/14 0738 09/01/14 1111 09/01/14 1555  GLUCAP 151* 143* 108* 197* 237*  Recent Results (from the past 240 hour(s))  Urine culture     Status: None   Collection Time: 08/28/14  4:12 PM  Result Value Ref Range Status   Specimen Description URINE, CATHETERIZED  Final   Special Requests NONE  Final   Colony Count   Final    >=100,000 COLONIES/ML Performed at [facility]    Culture   Final    SERRATIA MARCESCENS KLEBSIELLA ORNITHINOLYTICA Performed at [facility]    Report Status 09/01/2014 FINAL  Final   Organism ID, Bacteria SERRATIA MARCESCENS  Final   Organism ID, Bacteria KLEBSIELLA ORNITHINOLYTICA  Final      Susceptibility   Serratia marcescens - MIC*    CEFAZOLIN >=64 RESISTANT Resistant     CEFTRIAXONE <=1 SENSITIVE  Sensitive     CIPROFLOXACIN <=0.25 SENSITIVE Sensitive     GENTAMICIN <=1 SENSITIVE Sensitive     LEVOFLOXACIN <=0.12 SENSITIVE Sensitive     NITROFURANTOIN 128 RESISTANT Resistant     TOBRAMYCIN <=1 SENSITIVE Sensitive     TRIMETH/SULFA <=20 SENSITIVE Sensitive     * SERRATIA MARCESCENS   Klebsiella ornithinolytica - MIC*    AMPICILLIN >=32 RESISTANT Resistant     CEFAZOLIN >=64 RESISTANT Resistant     CEFTRIAXONE <=1 SENSITIVE Sensitive     CIPROFLOXACIN <=0.25 SENSITIVE Sensitive     GENTAMICIN <=1 SENSITIVE Sensitive     NITROFURANTOIN 64 INTERMEDIATE Intermediate     TOBRAMYCIN <=1 SENSITIVE Sensitive     TRIMETH/SULFA >=320 RESISTANT Resistant     PIP/TAZO <=4 SENSITIVE Sensitive     * KLEBSIELLA ORNITHINOLYTICA  Culture, blood (routine x 2)     Status: None   Collection Time: 08/28/14  4:28 PM  Result Value Ref Range Status   Specimen Description BLOOD RIGHT WRIST  Final   Special Requests BOTTLES DRAWN AEROBIC AND ANAEROBIC 6CC  Final   Culture NO GROWTH 5 DAYS  Final   Report Status 09/02/2014 FINAL  Final  Culture, blood (routine x 2)     Status: None   Collection Time: 08/28/14  4:35 PM  Result Value Ref Range Status   Specimen Description BLOOD LEFT WRIST  Final   Special Requests BOTTLES DRAWN AEROBIC ONLY 6CC  Final   Culture NO GROWTH 5 DAYS  Final   Report Status 09/02/2014 FINAL  Final  Culture, expectorated sputum-assessment     Status: None (Preliminary result)   Collection Time: 08/29/14 10:42 AM  Result Value Ref Range Status   Specimen Description SPUTUM EXPECTORATED  Final   Special Requests NONE  Final   Sputum evaluation   Final    THIS SPECIMEN IS ACCEPTABLE. RESPIRATORY CULTURE REPORT TO FOLLOW. Performed at [facility]    Report Status PENDING  Incomplete  MRSA PCR Screening     Status: None   Collection Time: 08/29/14 10:42 AM  Result Value Ref Range Status   MRSA by PCR NEGATIVE NEGATIVE Final    Comment:        The GeneXpert MRSA  Assay (FDA approved for NASAL specimens only), is one component of a comprehensive MRSA colonization surveillance program. It is not intended to diagnose MRSA infection nor to guide or monitor treatment for MRSA infections.   Culture, respiratory (NON-Expectorated)     Status: None   Collection Time: 08/29/14 10:42 AM  Result Value Ref Range Status   Specimen Description SPUTUM EXPECTORATED  Final   Special Requests NONE  Final   Gram Stain   Final  FEW WBC PRESENT, PREDOMINANTLY PMN FEW SQUAMOUS EPITHELIAL CELLS PRESENT MODERATE GRAM VARIABLE ROD MODERATE GRAM POSITIVE COCCI IN PAIRS IN CHAINS IN CLUSTERS    Culture   Final    NORMAL OROPHARYNGEAL FLORA [facility]    Report Status 08/31/2014 FINAL  Final     Studies: No results found.  Scheduled Meds: . albuterol  2.5 mg Nebulization TID  . aspirin  325 mg Oral Daily  . atorvastatin  80 mg Oral q1800  . azithromycin  500 mg Oral q1800  . cholecalciferol  1,000 Units Oral Daily  . docusate sodium  100 mg Oral QHS  . famotidine  10 mg Oral Daily  . feeding supplement (NEPRO CARB STEADY)  237 mL Oral BID PC  . furosemide  40 mg Intravenous BID  . guaiFENesin  1,200 mg Oral BID  . hydrALAZINE  25 mg Oral TID  . insulin aspart  0-15 Units Subcutaneous TID WC  . insulin aspart  0-5 Units Subcutaneous QHS  . insulin aspart  3 Units Subcutaneous TID WC  . insulin glargine  20 Units Subcutaneous BID  . isosorbide mononitrate  60 mg Oral Daily  . sodium chloride  3 mL Intravenous Q12H  . sodium chloride  3 mL Intravenous Q12H  . tiZANidine  2 mg Oral BID  . vitamin B-12  1,000 mcg Oral Daily  . vitamin C  500 mg Oral Daily   Continuous Infusions: . heparin 1,300 Units/hr (09/02/14 1000)    Principal Problem:   Acute on chronic respiratory failure Active Problems:   Atrial fibrillation   Diabetes type 2, uncontrolled   S/P bilateral BKA (below knee amputation)   Physical deconditioning    Acute on chronic systolic CHF (congestive heart failure)   Fever   Altered mental status   HCAP (healthcare-associated pneumonia)   Elevated troponin   NSTEMI (non-ST elevated myocardial infarction)    Time spent: 35 minutes. Greater than 50% of this time was spent in direct contact with the patient coordinating care.    Chaya JanHERNANDEZ ACOSTA,ESTELA  [facility] Pager [phone]  If 7PM-7AM, please contact night-coverage at [email or web address], password [email or web address] 09/02/2014, 10:54 AM  LOS: 5 days

## 2014-09-02 NOTE — Progress Notes (Signed)
Inpatient Diabetes Program Recommendations  AACE/ADA: New Consensus Statement on Inpatient Glycemic Control (2013)  Target Ranges:  Prepandial:   less than 140 mg/dL      Peak postprandial:   less than 180 mg/dL (1-2 hours)      Critically ill patients:  140 - 180 mg/dL  Results for Tyler Moon, Tyler Moon (MRN [id number]) as of 09/02/2014 07:46  Ref. Range 09/02/2014 05:23  Glucose Latest Range: 70-99 mg/dL 409119 (H)   Results for Tyler Moon, Tyler Moon (MRN [id number]) as of 09/02/2014 07:46  Ref. Range 09/01/2014 07:38 09/01/2014 11:11 09/01/2014 15:55  Glucose-Capillary Latest Range: 70-99 mg/dL 956108 (H) 213197 (H) 086237 (H)   Current orders for Inpatient glycemic control: Lantus 20 units BID, Novolog 0-15 units TID with meals, Novolog 0-5 units HS, Novolog 3 units TID with meals for meal coverage  Inpatient Diabetes Program Recommendations Insulin - Basal: Please consider increasing morning dose of Lantus to 22 units QAM and leaving bedtime Lantus at 20 units QHS.  Thanks, Orlando PennerMarie Belladonna Lubinski, RN, MSN, CCRN, CDE Diabetes Coordinator Inpatient Diabetes Program [phone] (Team Pager) [phone] ([facility] office) [phone] [facility] office)

## 2014-09-02 NOTE — Progress Notes (Signed)
ANTICOAGULATION CONSULT NOTE - follow up  Pharmacy Consult for Heparin Indication: chest pain/ACS  Allergies  Allergen Reactions  . Cephalexin Other (See Comments)    Unknown   . Ciprofloxacin Other (See Comments)    Unknown    Patient Measurements: Height: 6' (182.9 cm) Weight: 174 lb 2.6 oz (79 kg) IBW/kg (Calculated) : 77.6  Vital Signs: Temp: 97.5 F (36.4 C) (02/12 0400) Temp Source: Axillary (02/12 0400) BP: 138/56 mmHg (02/12 0730) Pulse Rate: 65 (02/12 0730)  Labs:  Recent Labs  08/31/14 0539 08/31/14 1440 08/31/14 1942  09/01/14 0221 09/01/14 1426 09/01/14 2049 09/02/14 0523  HGB  --   --   --   --  11.2*  --   --  11.2*  HCT  --   --   --   --  35.5*  --   --  35.7*  PLT  --   --   --   --  180  --   --  196  LABPROT  --   --   --   --  16.5*  --   --   --   INR  --   --   --   --  1.32  --   --   --   HEPARINUNFRC  --   --   --   < > 0.26* 0.27* 0.45 0.68  CREATININE 1.80*  --   --   --  1.78*  --   --  1.58*  TROPONINI  --  2.68* 4.74*  --  5.42*  --   --   --   < > = values in this interval not displayed. Estimated Creatinine Clearance: 34.8 mL/min (by C-G formula based on Cr of 1.58).  Medical History: Past Medical History  Diagnosis Date  . Atrial fibrillation   . Coronary atherosclerosis of native coronary artery     Multivessel  . TIA (transient ischemic attack)   . Anemia   . GI bleed   . Diabetes type 2, controlled   . Hyperlipidemia   . Peripheral arterial disease   . Essential hypertension, benign   . Partial deafness   . MI (myocardial infarction)   . S/P bilateral BKA (below knee amputation)   . Complete heart block     On sotalol  . First degree heart block   . Mitral regurgitation     mild-moderate  . Arthritis   . Neuromuscular disorder     neuropathy  . CHF (congestive heart failure)    Medications:  Prescriptions prior to admission  Medication Sig Dispense Refill Last Dose  . albuterol (PROVENTIL) (2.5 MG/3ML)  0.083% nebulizer solution Take 2.5 mg by nebulization every 6 (six) hours. For cough and congestion   08/28/2014 at 600a  . cholecalciferol (VITAMIN D) 1000 UNITS tablet Take 1,000 Units by mouth daily.   08/28/2014 at 800a  . docusate sodium (COLACE) 100 MG capsule Take 100 mg by mouth at bedtime.    08/27/2014 at 2000  . furosemide (LASIX) 40 MG tablet Take 1 tablet (40 mg total) by mouth daily. 30 tablet 0 08/28/2014 at 800a  . gabapentin (NEURONTIN) 600 MG tablet Take 600 mg by mouth daily as needed (for neuropathy. **May only be given at betime per [facility]**). **May only be given at bedtime per [facility]**   08/20/2014  . guaiFENesin (MUCINEX) 600 MG 12 hr tablet Take 600 mg by mouth 2 (two) times daily.   08/28/2014 at 800a  .  hydrALAZINE (APRESOLINE) 25 MG tablet Take 1 tablet (25 mg total) by mouth 3 (three) times daily. New medication for high blood pressure treatment. 90 tablet 3 08/28/2014 at 800a  . HYDROcodone-acetaminophen (NORCO/VICODIN) 5-325 MG per tablet Take 1 tablet by mouth every 4 (four) hours as needed for moderate pain. 30 tablet 0 08/26/2014 at Unknown time  . insulin aspart (NOVOLOG) 100 UNIT/ML injection Sliding-scale NovoLog 3 times daily: For blood sugar 70 to 130-no insulin. Blood sugar 131-150-give 3 units insulin. Blood sugar 151-200-give 4 units. Blood sugar 201-250-give 6 units. Blood sugar 231-300-give 9 units. Blood sugar 301-350-give 12 units. Blood sugar 351-400-give 15 units. Blood sugar greater than 400 give 20 units and call your Dr. (Patient taking differently: Inject 3-20 Units into the skin 3 (three) times daily. Per Sliding Scale  If 201-250= 2 units   251-300=  4 units   301-350=  6 units   351-400=  8 units If ove400= 10 units and call MD/NP) 10 mL 11 08/28/2014 at 630a  . insulin glargine (LANTUS) 100 UNIT/ML injection Inject 0.17 mLs (17 Units total) into the skin 2 (two) times daily. 10 mL 11 08/28/2014 at 800a  . isosorbide mononitrate (IMDUR) 60 MG 24 hr tablet Take 60 mg by  mouth daily.     08/28/2014 at 800a  . Nutritional Supplements (FEEDING SUPPLEMENT, NEPRO CARB STEADY,) LIQD Take 237 mLs by mouth 2 (two) times daily after a meal. 60 Can 0 08/28/2014 at 800a  . OXYGEN Inhale 2 L into the lungs daily.   08/27/2014 at Unknown time  . ranitidine (ZANTAC) 150 MG tablet Take 150 mg by mouth 2 (two) times daily.   08/28/2014 at 800a  . simvastatin (ZOCOR) 40 MG tablet Take 40 mg by mouth at bedtime.     08/27/2014 at 2000  . tiZANidine (ZANAFLEX) 4 MG capsule Take 2 mg by mouth 2 (two) times daily.    08/28/2014 at 800a  . vitamin B-12 (CYANOCOBALAMIN) 1000 MCG tablet Take 1,000 mcg by mouth daily.     08/28/2014 at 800a  . warfarin (COUMADIN) 5 MG tablet Take 7.5 mg by mouth every evening.    08/27/2014 at 1700  . nitroGLYCERIN (NITROSTAT) 0.4 MG SL tablet Place 0.4 mg under the tongue every 5 (five) minutes as needed for chest pain.   Never  . vitamin C (ASCORBIC ACID) 500 MG tablet Take 500 mg by mouth daily.     08/20/2014   Assessment: 89 yoM on chronic Coumadin for hx Afib [facility]  Cardiology was consulted today for elevated troponin.  Plan to initiate heparin for ACS vs. Demand ischemia with plans to bridge back to Coumadin once work-up complete.  Last Coumadin dose 2/6.  CBC reviewed / stable.  No bleeding noted.  Heparin level is therapeutic x 2 consecutive checks.    Goal of Therapy:  Heparin level 0.3-0.7 units/ml Monitor platelets by anticoagulation protocol: Yes   Plan:  Continue Heparin infusion at 1300 units/hr  CBC and HL daily while on Heparin  Valrie HartHall, Breelle Hollywood A 09/02/2014,7:46 AM

## 2014-09-02 NOTE — Care Management Utilization Note (Signed)
UR completed 

## 2014-09-02 NOTE — Progress Notes (Signed)
Primary cardiologist: Dr. Jonelle SidleSamuel G McDowell  Seen for followup: NSTEMI, CHF, conduction system disease  Subjective:    No active chest pain, family member states that he is not sleeping well, somewhat restless. Appetite fair.  Objective:   Temp:  [97.5 F (36.4 C)-97.8 F (36.6 C)] 97.5 F (36.4 C) (02/12 0400) Pulse Rate:  [59-84] 65 (02/12 0730) Resp:  [14-34] 31 (02/12 0730) BP: (91-146)/(37-77) 137/71 mmHg (02/12 0804) SpO2:  [91 %-100 %] 100 % (02/12 0730) Weight:  [174 lb 2.6 oz (79 kg)] 174 lb 2.6 oz (79 kg) (02/12 0500) Last BM Date: 08/31/14  Filed Weights   08/31/14 2119 09/01/14 0500 09/02/14 0500  Weight: 174 lb 6.1 oz (79.1 kg) 174 lb 6.1 oz (79.1 kg) 174 lb 2.6 oz (79 kg)    Intake/Output Summary (Last 24 hours) at 09/02/14 [id number] Last data filed at 09/02/14 0809  Gross per 24 hour  Intake 335.88 ml  Output   1250 ml  Net -914.12 ml    Telemetry: Sinus rhythm with markedly prolonged PR interval.  Exam:  General: Chronically ill-appearing male, no distress.  Lungs: Rhonchi, decreased breath sounds at the bases.  Cardiac: Distant, largely regular, no S3 gallop.  Abdomen: NABS.  Extremities: Status post amputations.  Lab Results:  Basic Metabolic Panel:  Recent Labs Lab 08/31/14 0539 09/01/14 0221 09/02/14 0523  NA 138 141 139  K 3.8 4.0 4.2  CL 98 104 101  CO2 30 31 32  GLUCOSE 137* 151* 119*  BUN 54* 49* 40*  CREATININE 1.80* 1.78* 1.58*  CALCIUM 8.9 8.7 8.6    Liver Function Tests:  Recent Labs Lab 08/29/14 0623  AST 26  ALT 20  ALKPHOS 58  BILITOT 0.9  PROT 5.9*  ALBUMIN 2.6*    CBC:  Recent Labs Lab 08/29/14 0623 09/01/14 0221 09/02/14 0523  WBC 5.7 4.6 5.3  HGB 11.1* 11.2* 11.2*  HCT 36.3* 35.5* 35.7*  MCV 100.3* 97.8 98.1  PLT 148* 180 196    Cardiac Enzymes:  Recent Labs Lab 08/31/14 1440 08/31/14 1942 09/01/14 0221  TROPONINI 2.68* 4.74* 5.42*    Coagulation:  Recent Labs Lab  09/01/14 0221  INR 1.32    ECG: Sinus rhythm with marked PR prolongation and left bundle branch block.   Medications:   Scheduled Medications: . albuterol  2.5 mg Nebulization TID  . aspirin  325 mg Oral Daily  . atorvastatin  80 mg Oral q1800  . azithromycin  500 mg Oral q1800  . cholecalciferol  1,000 Units Oral Daily  . docusate sodium  100 mg Oral QHS  . famotidine  10 mg Oral Daily  . feeding supplement (NEPRO CARB STEADY)  237 mL Oral BID PC  . furosemide  40 mg Intravenous BID  . guaiFENesin  1,200 mg Oral BID  . hydrALAZINE  25 mg Oral TID  . insulin aspart  0-15 Units Subcutaneous TID WC  . insulin aspart  0-5 Units Subcutaneous QHS  . insulin aspart  3 Units Subcutaneous TID WC  . insulin glargine  20 Units Subcutaneous BID  . isosorbide mononitrate  60 mg Oral Daily  . sodium chloride  3 mL Intravenous Q12H  . sodium chloride  3 mL Intravenous Q12H  . tiZANidine  2 mg Oral BID  . vitamin B-12  1,000 mcg Oral Daily  . vitamin C  500 mg Oral Daily    Infusions: . heparin 1,300 Units/hr (09/02/14 0600)    PRN Medications: sodium  chloride, acetaminophen **OR** acetaminophen, gabapentin, HYDROcodone-acetaminophen, nitroGLYCERIN, sodium chloride   Assessment:   1. NSTEMI, no active chest pain and hemodynamically stable. Plan is conservative medical management based on discussions with patient and family.  2. History of paroxysmal atrial fibrillation, was on Coumadin as an outpatient. Currently on heparin infusion.  3. Pneumonia, currently only azithromycin per primary team.  4. CKD, stage 3  5. Conduction system disease with history of transient heart block on sotalol. Currently maintaining sinus rhythm with markedly prolonged PR interval and left bundle branch block. No plan to pursue pacemaker following discussions with patient and family. He saw Dr. Johney Frame for EP consultation just recently at [facility].   Plan/Discussion:    Continue medical  management, no plans for invasive cardiac intervention after discussions with family. He continues on aspirin, Lipitor, hydralazine, Imdur, Lasix, and heparin. No beta blocker with conduction system disease as outlined above. Recommend further discussion about CODE STATUS, consider DNR order particularly in light of patient and family preference for conservative approach overall.   Jonelle Sidle, M.D., F.A.C.C.

## 2014-09-02 NOTE — Clinical Social Work Note (Signed)
Family is making decision on possibility of hospice today. Plan would be for home with hospice services if this is what they choose. [facility] remains willing to accept pt if plan is to return to SNF.  Derenda FennelKara Barney Gertsch, Kentucky[place]CS[place] [phone]

## 2014-09-02 NOTE — Progress Notes (Signed)
OT Cancellation Note  Patient Details Name: Tyler Moon MRN: [id number] DOB: 06/08/1923   Cancelled Treatment:    Reason Eval/Treat Not Completed: Patient not medically ready. Hospice care consulted due to patient's poor prognosis. Patient transferred to ICU after critical troponin values. Will remove from OT caseload at this time. If patient's medical status improves will need a new OT order.   Tyler Moon, OTR/L,CBIS  [phone]  09/02/2014, 8:06 AM

## 2014-09-03 DIAGNOSIS — I214 Non-ST elevation (NSTEMI) myocardial infarction: Secondary | ICD-10-CM

## 2014-09-03 LAB — HEPARIN LEVEL (UNFRACTIONATED): HEPARIN UNFRACTIONATED: 0.73 [IU]/mL — AB (ref 0.30–0.70)

## 2014-09-03 LAB — CBC
HCT: 37.9 % — ABNORMAL LOW (ref 39.0–52.0)
Hemoglobin: 12 g/dL — ABNORMAL LOW (ref 13.0–17.0)
MCH: 30.8 pg (ref 26.0–34.0)
MCHC: 31.7 g/dL (ref 30.0–36.0)
MCV: 97.4 fL (ref 78.0–100.0)
Platelets: 216 10*3/uL (ref 150–400)
RBC: 3.89 MIL/uL — AB (ref 4.22–5.81)
RDW: 12.9 % (ref 11.5–15.5)
WBC: 6 10*3/uL (ref 4.0–10.5)

## 2014-09-03 MED ORDER — HYDROCODONE-ACETAMINOPHEN 5-325 MG PO TABS: 1.0000 | ORAL_TABLET | ORAL | Status: AC | PRN

## 2014-09-03 MED ORDER — APIXABAN 5 MG PO TABS: 5.0000 mg | ORAL_TABLET | Freq: Two times a day (BID) | ORAL | Status: AC

## 2014-09-03 MED ORDER — ATORVASTATIN CALCIUM 80 MG PO TABS: 80.0000 mg | ORAL_TABLET | Freq: Every day | ORAL | Status: AC

## 2014-09-03 MED ORDER — ACETAMINOPHEN 325 MG PO TABS: 650.0000 mg | ORAL_TABLET | Freq: Four times a day (QID) | ORAL | Status: AC | PRN

## 2014-09-03 MED ORDER — CETYLPYRIDINIUM CHLORIDE 0.05 % MT LIQD
7.0000 mL | Freq: Two times a day (BID) | OROMUCOSAL | Status: DC
  Administered 2014-09-03: 7 mL via OROMUCOSAL

## 2014-09-03 NOTE — Progress Notes (Signed)
Pt is to be discharged today. Pt is in NAD, IV is out, all paperwork has been reviewed/discussed with patient, and there are no questions/concerns at this time. Assessment is unchanged from this morning. Pt is to be accompanied downstairs by staff and family via EMS.

## 2014-09-03 NOTE — Progress Notes (Signed)
ANTICOAGULATION CONSULT NOTE - follow up  Pharmacy Consult for Heparin Indication: chest pain/ACS  Allergies  Allergen Reactions  . Cephalexin Other (See Comments)    Unknown   . Ciprofloxacin Other (See Comments)    Unknown    Patient Measurements: Height: 6' (182.9 cm) Weight: 167 lb 5.3 oz (75.9 kg) IBW/kg (Calculated) : 77.6  Vital Signs: Temp: 97.6 F (36.4 C) (02/13 0730) Temp Source: Oral (02/13 0730) BP: 124/101 mmHg (02/13 0700) Pulse Rate: 67 (02/13 0700)  Labs:  Recent Labs  08/31/14 1440 08/31/14 1942  09/01/14 0221  09/01/14 2049 09/02/14 0523 09/03/14 0528  HGB  --   --   < > 11.2*  --   --  11.2* 12.0*  HCT  --   --   --  35.5*  --   --  35.7* 37.9*  PLT  --   --   --  180  --   --  196 216  LABPROT  --   --   --  16.5*  --   --   --   --   INR  --   --   --  1.32  --   --   --   --   HEPARINUNFRC  --   --   --  0.26*  < > 0.45 0.68 0.73*  CREATININE  --   --   --  1.78*  --   --  1.58*  --   TROPONINI 2.68* 4.74*  --  5.42*  --   --   --   --   < > = values in this interval not displayed. Estimated Creatinine Clearance: 34 mL/min (by C-G formula based on Cr of 1.58).  Medical History: Past Medical History  Diagnosis Date  . Atrial fibrillation   . Coronary atherosclerosis of native coronary artery     Multivessel  . TIA (transient ischemic attack)   . Anemia   . GI bleed   . Diabetes type 2, controlled   . Hyperlipidemia   . Peripheral arterial disease   . Essential hypertension, benign   . Partial deafness   . MI (myocardial infarction)   . S/P bilateral BKA (below knee amputation)   . Complete heart block     On sotalol  . First degree heart block   . Mitral regurgitation     mild-moderate  . Arthritis   . Neuromuscular disorder     neuropathy  . CHF (congestive heart failure)    Assessment: 89 yoM on chronic Coumadin for hx Afib was [facility] & [facility]  Cardiology was consulted today for  elevated troponin.  Plan to initiate heparin for NSTEMI. Last Coumadin dose 2/6.  CBC reviewed / stable.  No bleeding noted.  Heparin level is is slightly above goal.  Medical management planned with possibility for Hospice care noted.  Goal of Therapy:  Heparin level 0.3-0.7 units/ml Monitor platelets by anticoagulation protocol: Yes   Plan:  Decrease Heparin infusion to 1200 units/hr  CBC and HL daily while on Heparin  Mady GemmaHayes, Miel Wisener R 09/03/2014,8:48 AM

## 2014-09-03 NOTE — Discharge Summary (Signed)
Physician Discharge Summary  Tyler Moon:[id number] DOB: [birth year] DOA: 08/28/2014  PCP: [name], MD  Admit date: 08/28/2014 Discharge date: 09/03/2014  Time spent: 45 minutes  Recommendations for Outpatient Follow-up:  -Will be discharged home today with home hospice.   Discharge Diagnoses:  Principal Problem:   Acute on chronic respiratory failure Active Problems:   Atrial fibrillation   Diabetes type 2, uncontrolled   S/P bilateral BKA (below knee amputation)   Physical deconditioning   Acute on chronic systolic CHF (congestive heart failure)   Fever   Altered mental status   HCAP (healthcare-associated pneumonia)   Elevated troponin   NSTEMI (non-ST elevated myocardial infarction)   Discharge Condition: Stable, guarded  Filed Weights   09/01/14 0500 09/02/14 0500 09/03/14 0500  Weight: 79.1 kg (174 lb 6.1 oz) 79 kg (174 lb 2.6 oz) 75.9 kg (167 lb 5.3 oz)    History of present illness:  81 yo male with CHF (EF 35-40%), CAD, Dm2, apparently had some question of altered ms, and fever while at SNF. Pt appears to have increased o2 requirement as well. Pt sent to ED for evaluation. Pt was noted to have possible uti, along with CHF on CXR which has improved. Unable to tell if there could be any pneumonia on the LL base. Pt will be admitted for w/up of possible pneumonia/uti.   Hospital Course:   NSTEMI -Will DC heparin drip as has completed 72 hours of treatment. -Plan for conservative management given age and comorbidities. -Appreciate cardiology input. -Patient will go home on hospice care.  ?HCAP -Completed treatment.  CKD Stage III -At baseline  HTN -Controlled.  Acute on Chronic Hypoxemic Respiratory Failure -Likely related to ongoing cardiac >pulmonary issues. -Continue lasix at home. -Oxygen as needed.  Atrial Fibrillation -Was on coumadin prior to admission. -Despite him going on hospice, will elect to continue anticoagulation for  stroke prophylaxis. After discussion with family, we have chosen one of the newer agents (eliquis) which will allow for less monitoring in a home bound, hospice patient.  Acute on Chronic Systolic CHF -Continue lasix. -1.8L negative since admission.  DM -Fair control  Procedures:  None   Consultations:  Cardiology  Discharge Instructions  Discharge Instructions    Diet - low sodium heart healthy    Complete by:  As directed      Increase activity slowly    Complete by:  As directed             Medication List    STOP taking these medications        gabapentin 600 MG tablet  Commonly known as:  NEURONTIN     simvastatin 40 MG tablet  Commonly known as:  ZOCOR     warfarin 5 MG tablet  Commonly known as:  COUMADIN      TAKE these medications        acetaminophen 325 MG tablet  Commonly known as:  TYLENOL  Take 2 tablets (650 mg total) by mouth every 6 (six) hours as needed for mild pain (or Fever >/= 101).     albuterol (2.5 MG/3ML) 0.083% nebulizer solution  Commonly known as:  PROVENTIL  Take 2.5 mg by nebulization every 6 (six) hours. For cough and congestion     apixaban 5 MG Tabs tablet  Commonly known as:  ELIQUIS  Take 1 tablet (5 mg total) by mouth 2 (two) times daily.     atorvastatin 80 MG tablet  Commonly known as:  LIPITOR  Take 1 tablet (80 mg total) by mouth daily at 6 PM.     cholecalciferol 1000 UNITS tablet  Commonly known as:  VITAMIN D  Take 1,000 Units by mouth daily.     docusate sodium 100 MG capsule  Commonly known as:  COLACE  Take 100 mg by mouth at bedtime.     feeding supplement (NEPRO CARB STEADY) Liqd  Take 237 mLs by mouth 2 (two) times daily after a meal.     furosemide 40 MG tablet  Commonly known as:  LASIX  Take 1 tablet (40 mg total) by mouth daily.     guaiFENesin 600 MG 12 hr tablet  Commonly known as:  MUCINEX  Take 600 mg by mouth 2 (two) times daily.     hydrALAZINE 25 MG tablet  Commonly known as:   APRESOLINE  Take 1 tablet (25 mg total) by mouth 3 (three) times daily. New medication for high blood pressure treatment.     HYDROcodone-acetaminophen 5-325 MG per tablet  Commonly known as:  NORCO/VICODIN  Take 1 tablet by mouth every 4 (four) hours as needed for moderate pain.     insulin aspart 100 UNIT/ML injection  Commonly known as:  novoLOG  - Sliding-scale NovoLog 3 times daily:  - For blood sugar 70 to 130-no insulin. Blood sugar 131-150-give 3 units insulin. Blood sugar 151-200-give 4 units. Blood sugar 201-250-give 6 units. Blood sugar 231-300-give 9 units. Blood sugar 301-350-give 12 units. Blood sugar 351-400-give 15 units. Blood sugar greater than 400 give 20 units and call your Dr.     insulin glargine 100 UNIT/ML injection  Commonly known as:  LANTUS  Inject 0.17 mLs (17 Units total) into the skin 2 (two) times daily.     isosorbide mononitrate 60 MG 24 hr tablet  Commonly known as:  IMDUR  Take 60 mg by mouth daily.     nitroGLYCERIN 0.4 MG SL tablet  Commonly known as:  NITROSTAT  Place 0.4 mg under the tongue every 5 (five) minutes as needed for chest pain.     OXYGEN  Inhale 2 L into the lungs daily.     ranitidine 150 MG tablet  Commonly known as:  ZANTAC  Take 150 mg by mouth 2 (two) times daily.     tiZANidine 4 MG capsule  Commonly known as:  ZANAFLEX  Take 2 mg by mouth 2 (two) times daily.     vitamin B-12 1000 MCG tablet  Commonly known as:  CYANOCOBALAMIN  Take 1,000 mcg by mouth daily.     vitamin C 500 MG tablet  Commonly known as:  ASCORBIC ACID  Take 500 mg by mouth daily.       Allergies  Allergen Reactions  . Cephalexin Other (See Comments)    Unknown   . Ciprofloxacin Other (See Comments)    Unknown       The results of significant diagnostics from this hospitalization (including imaging, microbiology, ancillary and laboratory) are listed below for reference.    Significant Diagnostic Studies: Dg Chest 1 View  08/20/2014    CLINICAL DATA:  Status post fall 1 day ago.  Bilateral hip pain.  EXAM: CHEST - 1 VIEW  COMPARISON:  08/18/2014  FINDINGS: Mild bilateral interstitial thickening. Moderate right and trace left pleural effusions. Bilateral central vascular prominence. No pneumothorax. Stable cardiomegaly. Prior CABG. No acute osseous abnormality.  IMPRESSION: Overall findings concerning for mild CHF.   Electronically Signed   By: Elige Ko  On: 08/20/2014 20:03   Ct Head Wo Contrast  08/29/2014   CLINICAL DATA:  Altered mental status, possibly UTI  EXAM: CT HEAD WITHOUT CONTRAST  TECHNIQUE: Contiguous axial images were obtained from the base of the skull through the vertex without intravenous contrast.  COMPARISON:  08/20/2014  FINDINGS: No evidence of parenchymal hemorrhage or extra-axial fluid collection. No mass lesion, mass effect, or midline shift.  No CT evidence of acute infarction.  Encephalomalacic changes related to old right temporoparietal infarct.  Subcortical white matter and periventricular small vessel ischemic changes. Intracranial atherosclerosis.  Global cortical atrophy.  No ventriculomegaly.  The visualized paranasal sinuses are essentially clear. The mastoid air cells are unopacified.  No evidence of calvarial fracture.  IMPRESSION: No evidence of acute intracranial abnormality.  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  Atrophy with small vessel ischemic changes.   Electronically Signed   By: Charline BillsSriyesh  Krishnan M.D.   On: 08/29/2014 09:08   Ct Head Wo Contrast  08/20/2014   CLINICAL DATA:  Status post fall 08/19/2014 with a blow to the head. The patient is anticoagulated.  EXAM: CT HEAD WITHOUT CONTRAST  TECHNIQUE: Contiguous axial images were obtained from the base of the skull through the vertex without intravenous contrast.  COMPARISON:  Head CT scan 07/06/2014.  FINDINGS: The brain is atrophic with chronic microvascular ischemic change. Large, remote right parietal and temporal lobe infarct is  identified. There is no evidence of acute abnormality in infarction, hemorrhage, mass lesion, mass effect, midline shift or abnormal extra-axial fluid collection. No hydrocephalus or pneumocephalus. The calvarium is intact. Imaged paranasal sinuses and mastoid air cells are clear.  IMPRESSION: No acute abnormality.  Atrophy, chronic microvascular ischemic change and remote right temporal and parietal lobe infarct.   Electronically Signed   By: [name]  Dalessio M.D.   On: 08/20/2014 19:42   Dg Chest Port 1 View  08/30/2014   CLINICAL DATA:  Systolic Heart failure, diabetes, hypertension, shortness of Breath  EXAM: PORTABLE CHEST - 1 VIEW  COMPARISON:  08/28/2014  FINDINGS: Moderate interstitial airspace opacities involving bases more than apices, increased from previous exam. Probable left pleural effusion as before. Heart size upper limits normal for technique. Previous CABG. Atheromatous aorta.  IMPRESSION: 1. Worsening bilateral edema/infiltrates   Electronically Signed   By: Corlis Leak  Hassell M.D.   On: 08/30/2014 17:31   Dg Chest Portable 1 View  08/28/2014   CLINICAL DATA:  Cough and fever.  EXAM: PORTABLE CHEST - 1 VIEW  COMPARISON:  08/24/2014 and 08/20/2014  FINDINGS: There is chronic cardiomegaly. Moderate left effusion persists. Small right effusion has diminished. Pulmonary edema has improved and the pulmonary vascularity is now normal.  IMPRESSION: Improved pulmonary edema. Resolution of pulmonary vascular congestion. Decreased right effusion. Stable moderate left effusion.   Electronically Signed   By: Geanie CooleyJim  Maxwell M.D.   On: 08/28/2014 17:03   Dg Chest Port 1 View  08/24/2014   CLINICAL DATA:  Pulmonary edema.  Coughing congestion.  Recent fall.  EXAM: PORTABLE CHEST - 1 VIEW  COMPARISON:  08/20/2014  FINDINGS: Sequelae of prior CABG are again identified. Cardiac silhouette is partially obscured but appears mildly enlarged. There is persistent left basilar opacity likely reflecting a combination left  hemidiaphragm elevation, small left pleural effusion, and atelectasis. There is a small right pleural effusion, increased from prior. Diffuse interstitial lung opacities have increased from the prior study, with patchy airspace opacities in both lung bases. No pneumothorax is identified. No acute osseous abnormality is identified.  IMPRESSION: Findings consistent with worsening pulmonary edema and small bilateral pleural effusions.   Electronically Signed   By: Sebastian Ache   On: 08/24/2014 16:24   Dg Hips Bilat With Pelvis 2v  08/20/2014   CLINICAL DATA:  Status post fall.  Left hip pain.  EXAM: DG HIP W/ PELVIS 2V BILAT  COMPARISON:  None.  FINDINGS: There is severe osteopenia. There is no hip fracture or dislocation. There is a nondisplaced fracture of the left inferior pubic ramus.  There is peripheral vascular atherosclerotic disease.  IMPRESSION: 1. No hip fracture or dislocation. 2. Nondisplaced fracture of the left inferior pubic ramus.   Electronically Signed   By: Elige Ko   On: 08/20/2014 20:05    Microbiology: Recent Results (from the past 240 hour(s))  Urine culture     Status: None   Collection Time: 08/28/14  4:12 PM  Result Value Ref Range Status   Specimen Description URINE, CATHETERIZED  Final   Special Requests NONE  Final   Colony Count   Final    >=100,000 COLONIES/ML Performed at [facility]    Culture   Final    SERRATIA MARCESCENS KLEBSIELLA ORNITHINOLYTICA Performed at [facility]    Report Status 09/01/2014 FINAL  Final   Organism ID, Bacteria SERRATIA MARCESCENS  Final   Organism ID, Bacteria KLEBSIELLA ORNITHINOLYTICA  Final      Susceptibility   Serratia marcescens - MIC*    CEFAZOLIN >=64 RESISTANT Resistant     CEFTRIAXONE <=1 SENSITIVE Sensitive     CIPROFLOXACIN <=0.25 SENSITIVE Sensitive     GENTAMICIN <=1 SENSITIVE Sensitive     LEVOFLOXACIN <=0.12 SENSITIVE Sensitive     NITROFURANTOIN 128 RESISTANT Resistant     TOBRAMYCIN  <=1 SENSITIVE Sensitive     TRIMETH/SULFA <=20 SENSITIVE Sensitive     * SERRATIA MARCESCENS   Klebsiella ornithinolytica - MIC*    AMPICILLIN >=32 RESISTANT Resistant     CEFAZOLIN >=64 RESISTANT Resistant     CEFTRIAXONE <=1 SENSITIVE Sensitive     CIPROFLOXACIN <=0.25 SENSITIVE Sensitive     GENTAMICIN <=1 SENSITIVE Sensitive     NITROFURANTOIN 64 INTERMEDIATE Intermediate     TOBRAMYCIN <=1 SENSITIVE Sensitive     TRIMETH/SULFA >=320 RESISTANT Resistant     PIP/TAZO <=4 SENSITIVE Sensitive     * KLEBSIELLA ORNITHINOLYTICA  Culture, blood (routine x 2)     Status: None   Collection Time: 08/28/14  4:28 PM  Result Value Ref Range Status   Specimen Description BLOOD RIGHT WRIST  Final   Special Requests BOTTLES DRAWN AEROBIC AND ANAEROBIC 6CC  Final   Culture NO GROWTH 5 DAYS  Final   Report Status 09/02/2014 FINAL  Final  Culture, blood (routine x 2)     Status: None   Collection Time: 08/28/14  4:35 PM  Result Value Ref Range Status   Specimen Description BLOOD LEFT WRIST  Final   Special Requests BOTTLES DRAWN AEROBIC ONLY 6CC  Final   Culture NO GROWTH 5 DAYS  Final   Report Status 09/02/2014 FINAL  Final  Culture, expectorated sputum-assessment     Status: None (Preliminary result)   Collection Time: 08/29/14 10:42 AM  Result Value Ref Range Status   Specimen Description SPUTUM EXPECTORATED  Final   Special Requests NONE  Final   Sputum evaluation   Final    THIS SPECIMEN IS ACCEPTABLE. RESPIRATORY CULTURE REPORT TO FOLLOW. Performed at [facility]    Report Status PENDING  Incomplete  MRSA PCR Screening     Status: None   Collection Time: 08/29/14 10:42 AM  Result Value Ref Range Status   MRSA by PCR NEGATIVE NEGATIVE Final    Comment:        The GeneXpert MRSA Assay (FDA approved for NASAL specimens only), is one component of a comprehensive MRSA colonization surveillance program. It is not intended to diagnose MRSA infection nor to guide or monitor  treatment for MRSA infections.   Culture, respiratory (NON-Expectorated)     Status: None   Collection Time: 08/29/14 10:42 AM  Result Value Ref Range Status   Specimen Description SPUTUM EXPECTORATED  Final   Special Requests NONE  Final   Gram Stain   Final    FEW WBC PRESENT, PREDOMINANTLY PMN FEW SQUAMOUS EPITHELIAL CELLS PRESENT MODERATE GRAM VARIABLE ROD MODERATE GRAM POSITIVE COCCI IN PAIRS IN CHAINS IN CLUSTERS    Culture   Final    NORMAL OROPHARYNGEAL FLORA Performed at [facility]    Report Status 08/31/2014 FINAL  Final     Labs: Basic Metabolic Panel:  Recent Labs Lab 08/29/14 0623 08/30/14 1058 08/31/14 0539 09/01/14 0221 09/02/14 0523  NA 138 135 138 141 139  K 4.5 4.1 3.8 4.0 4.2  CL 100 96 98 104 101  CO2 30 32 30 31 32  GLUCOSE 479* 287* 137* 151* 119*  BUN 43* 59* 54* 49* 40*  CREATININE 1.70* 1.92* 1.80* 1.78* 1.58*  CALCIUM 8.8 8.7 8.9 8.7 8.6   Liver Function Tests:  Recent Labs Lab 08/29/14 0623  AST 26  ALT 20  ALKPHOS 58  BILITOT 0.9  PROT 5.9*  ALBUMIN 2.6*   No results for input(s): LIPASE, AMYLASE in the last 168 hours. No results for input(s): AMMONIA in the last 168 hours. CBC:  Recent Labs Lab 08/28/14 1610 08/29/14 0623 09/01/14 0221 09/02/14 0523 09/03/14 0528  WBC 9.1 5.7 4.6 5.3 6.0  NEUTROABS 7.5 5.3  --   --   --   HGB 11.3* 11.1* 11.2* 11.2* 12.0*  HCT 36.3* 36.3* 35.5* 35.7* 37.9*  MCV 99.5 100.3* 97.8 98.1 97.4  PLT 169 148* 180 196 216   Cardiac Enzymes:  Recent Labs Lab 08/31/14 1440 08/31/14 1942 09/01/14 0221  TROPONINI 2.68* 4.74* 5.42*   BNP: BNP (last 3 results)  Recent Labs  08/28/14 2300 08/31/14 1942 09/01/14 0221  BNP 715.0* 1055.0* 1100.0*    ProBNP (last 3 results)  Recent Labs  07/06/14 2138  PROBNP 2693.0*    CBG:  Recent Labs Lab 08/31/14 2040 08/31/14 2202 09/01/14 0738 09/01/14 1111 09/01/14 1555  GLUCAP 151* 143* 108* 197* 237*        Signed:  HERNANDEZ ACOSTA,ESTELA  [facility] Pager: [phone] 09/03/2014, 10:08 AM

## 2014-09-05 LAB — GLUCOSE, CAPILLARY
GLUCOSE-CAPILLARY: 106 mg/dL — AB (ref 70–99)
GLUCOSE-CAPILLARY: 190 mg/dL — AB (ref 70–99)
Glucose-Capillary: 206 mg/dL — ABNORMAL HIGH (ref 70–99)
Glucose-Capillary: 123 mg/dL — ABNORMAL HIGH (ref 70–99)

## 2014-09-06 ENCOUNTER — Telehealth: Payer: Self-pay | Admitting: Cardiology

## 2014-09-06 LAB — GLUCOSE, CAPILLARY
GLUCOSE-CAPILLARY: 156 mg/dL — AB (ref 70–99)
Glucose-Capillary: 140 mg/dL — ABNORMAL HIGH (ref 70–99)
Glucose-Capillary: 104 mg/dL — ABNORMAL HIGH (ref 70–99)
Glucose-Capillary: 69 mg/dL — ABNORMAL LOW (ref 70–99)

## 2014-09-06 NOTE — Telephone Encounter (Signed)
Mrs. Tyler Moon called requesting that we cancel upcoming appointment. [name] is under the care of Hospice. She is requesting that we cancel his appointment

## 2014-09-08 LAB — EXPECTORATED SPUTUM ASSESSMENT W REFEX TO RESP CULTURE

## 2014-09-08 LAB — EXPECTORATED SPUTUM ASSESSMENT W GRAM STAIN, RFLX TO RESP C

## 2014-09-12 ENCOUNTER — Ambulatory Visit: Payer: Medicare Other | Admitting: Cardiology

## 2014-09-23 ENCOUNTER — Ambulatory Visit: Payer: Medicare Other | Admitting: Cardiology

## 2014-10-21 DEATH — deceased

## 2014-11-17 ENCOUNTER — Ambulatory Visit: Payer: Medicare Other | Admitting: Cardiology

## 2015-01-30 ENCOUNTER — Encounter: Payer: Self-pay | Admitting: *Deleted

## 2016-05-11 IMAGING — CT CT HEAD W/O CM
1 series · 16 of 30 positions shown, 20 images · non-contrast
Comparison: Head CT scan 07/06/2014.

CLINICAL DATA: Status post fall 08/19/2014 with a blow to the head.
The patient is anticoagulated.

EXAM:
CT HEAD WITHOUT CONTRAST
TECHNIQUE: Contiguous axial images were obtained from the base of the skull
through the vertex without intravenous contrast.

[Series 2: headseq 4.8 h37s · axial · 0.46mm/px · z∈[+116,+274]mm · 16 of 36 slices shown, 20 images]
[im 2/36  brain]
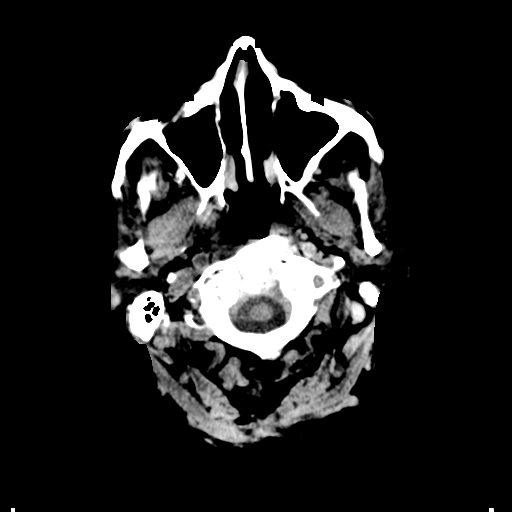
[im 2/36  bone]
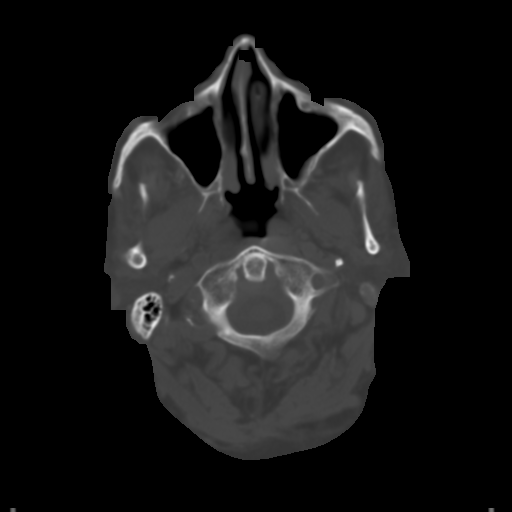
[im 4/36  brain]
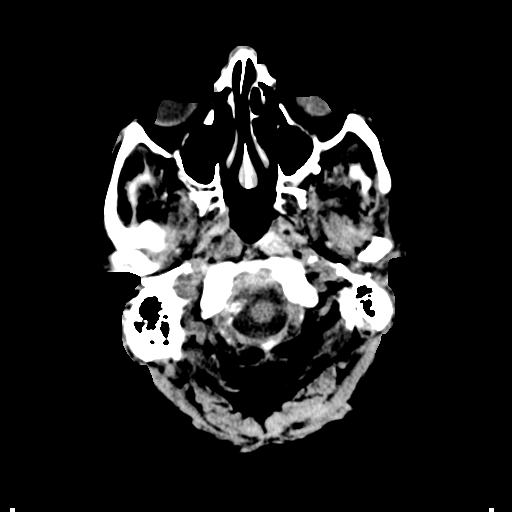
[im 7/36  brain]
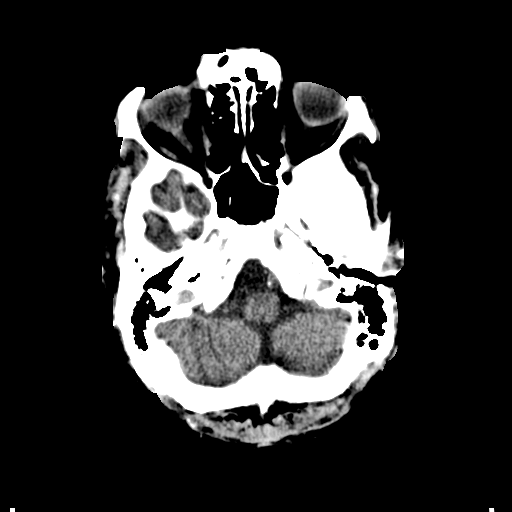
[im 9/36  brain]
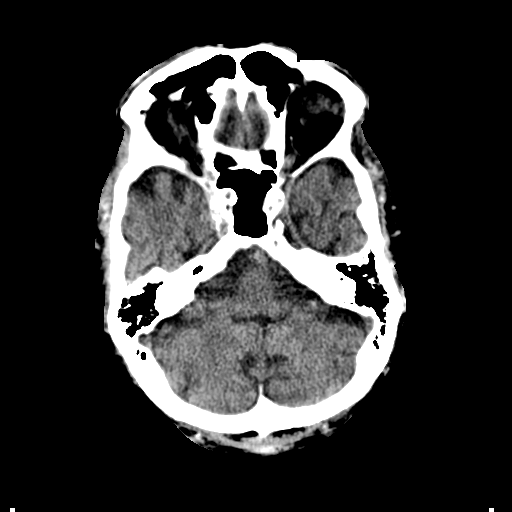
[im 10/36  brain]
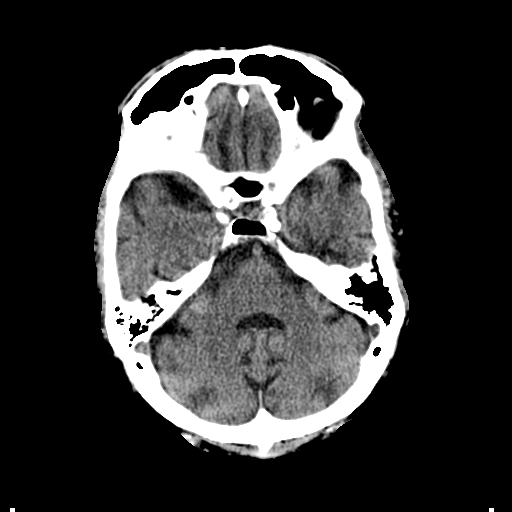
[im 10/36  bone]
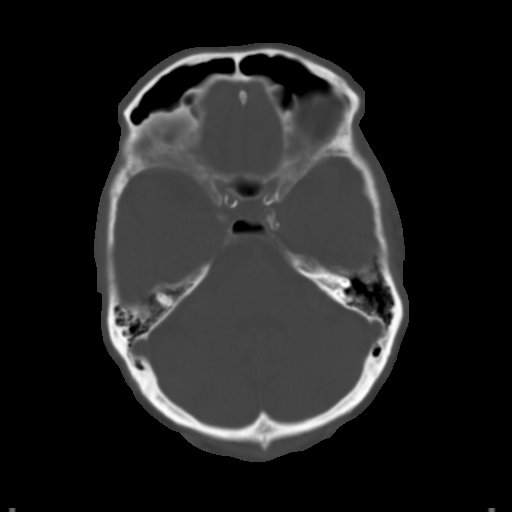
[im 13/36  brain]
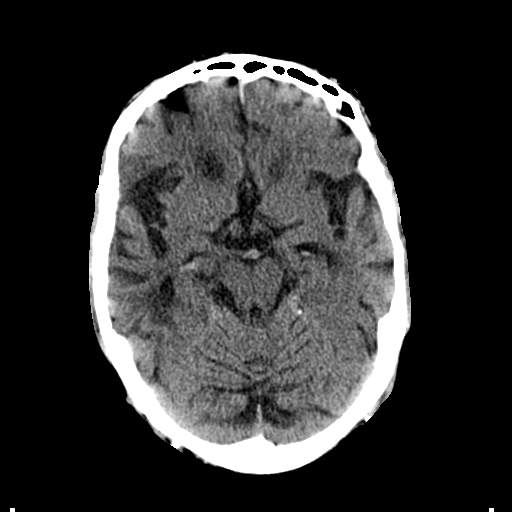
[im 15/36  brain]
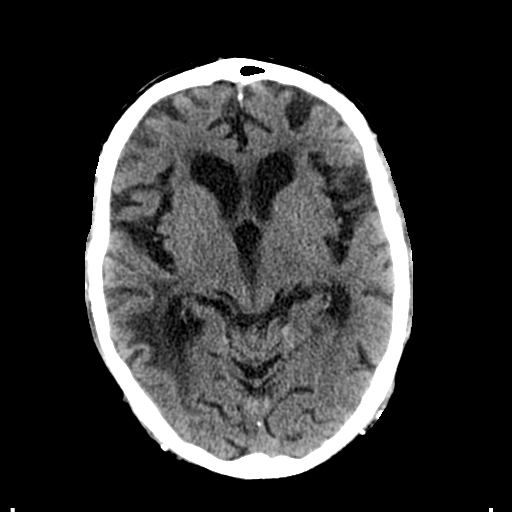
[im 17/36  brain]
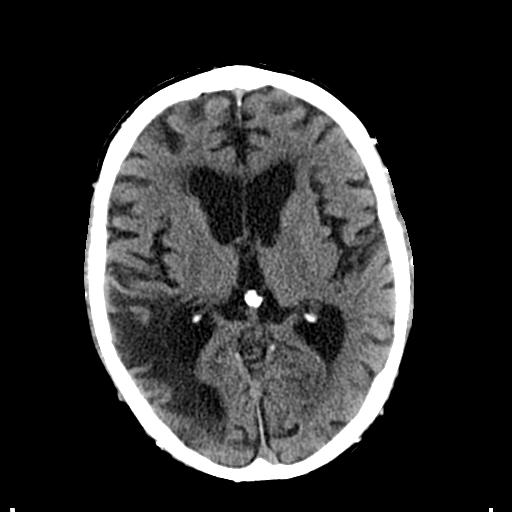
[im 19/36  brain]
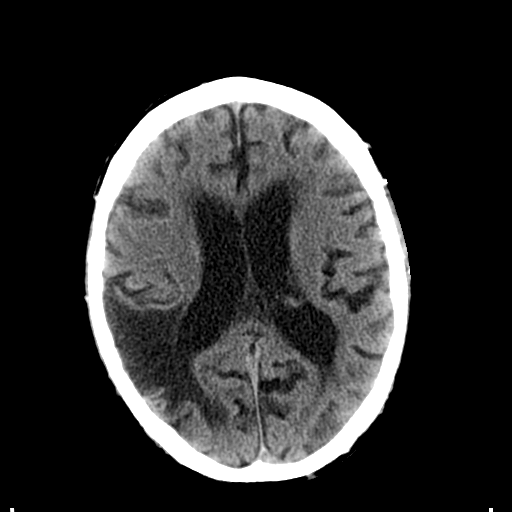
[im 19/36  bone]
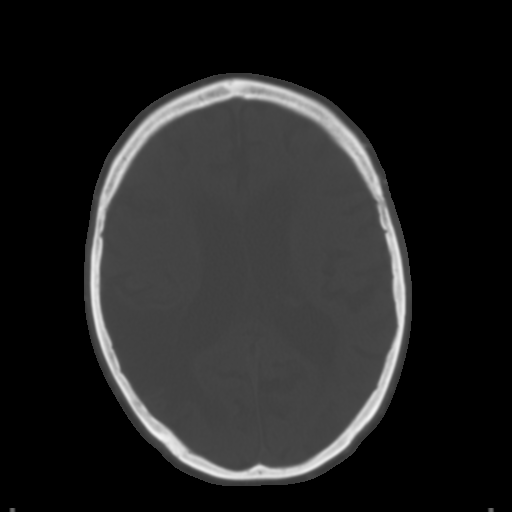
[im 21/36  brain]
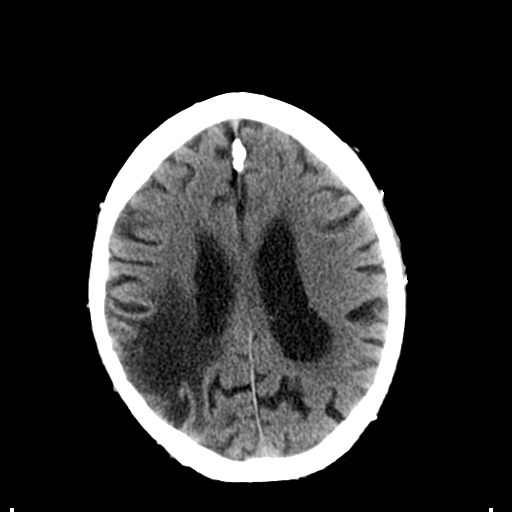
[im 23/36  brain]
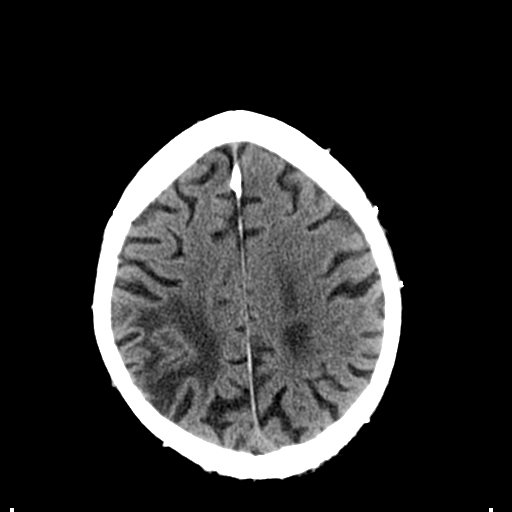
[im 26/36  brain]
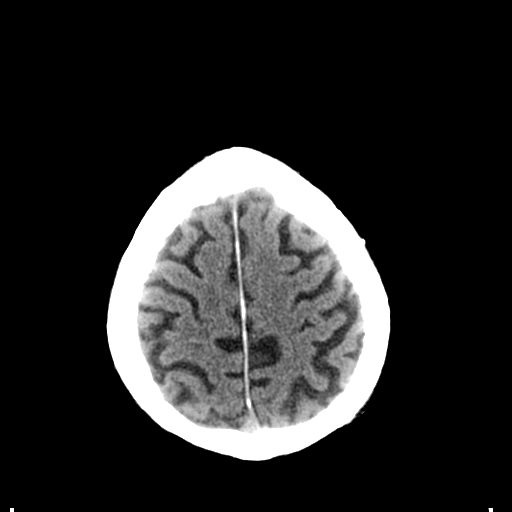
[im 27/36  brain]
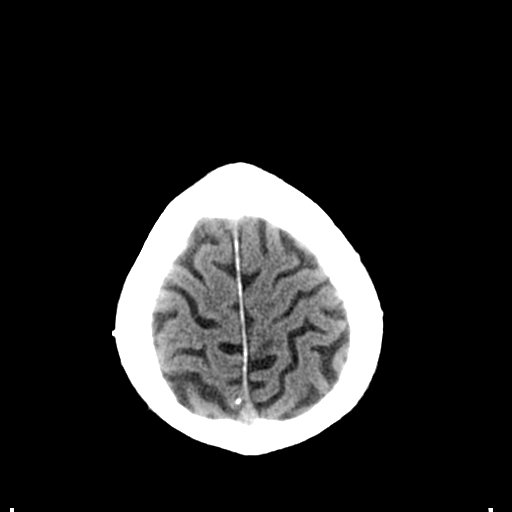
[im 27/36  bone]
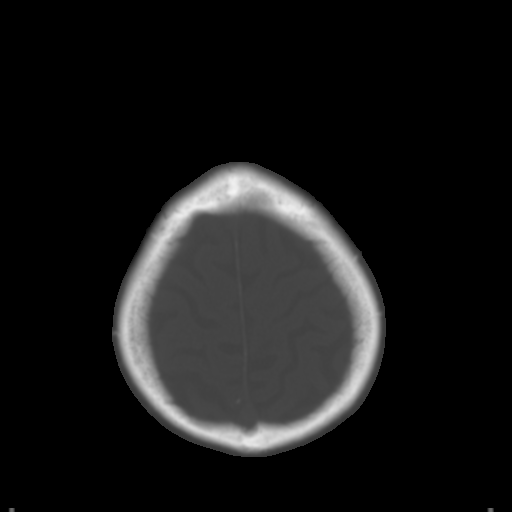
[im 29/36  brain]
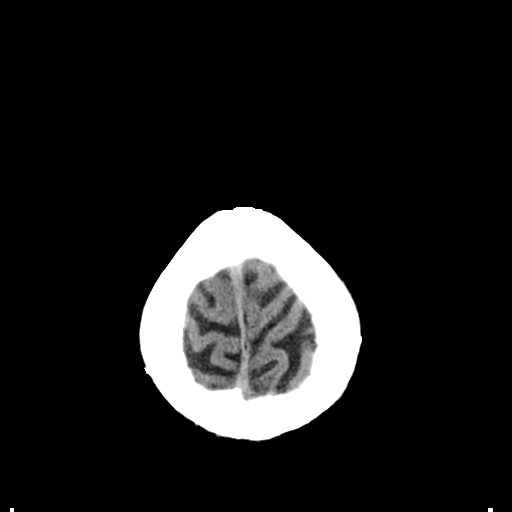
[im 32/36  brain]
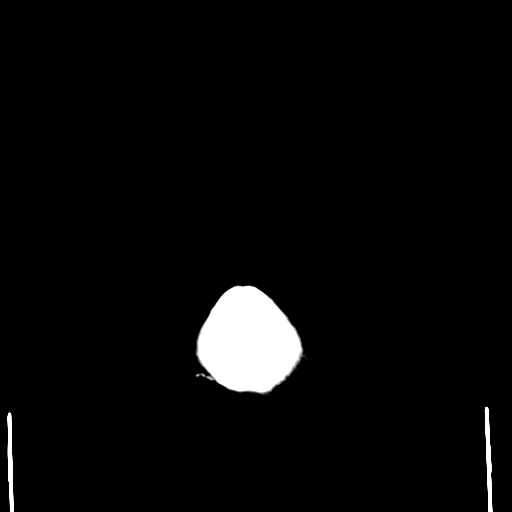
[im 34/36  brain]
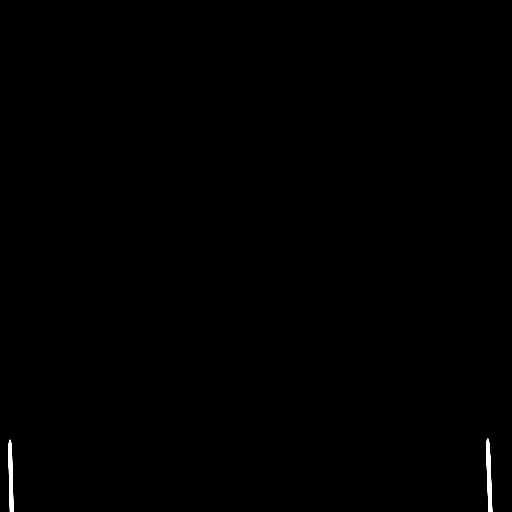

[16 of 30 positions shown; findings below may reference images not displayed]

FINDINGS: The brain is atrophic with chronic microvascular ischemic change.
Large, remote right parietal and temporal lobe infarct is
identified. There is no evidence of acute abnormality in infarction,
hemorrhage, mass lesion, mass effect, midline shift or abnormal
extra-axial fluid collection. No hydrocephalus or pneumocephalus.
The calvarium is intact. Imaged paranasal sinuses and mastoid air
cells are clear.
IMPRESSION: No acute abnormality.

Atrophy, chronic microvascular ischemic change and remote right
temporal and parietal lobe infarct.

## 2016-05-11 IMAGING — DX DG HIP (WITH OR WITHOUT PELVIS) 2V BILAT
6 series · 6 of 6 positions shown · non-contrast
Comparison: None.

CLINICAL DATA: Status post fall.  Left hip pain.

EXAM:
DG HIP W/ PELVIS 2V BILAT

[pelvis ap (1 of 2)]
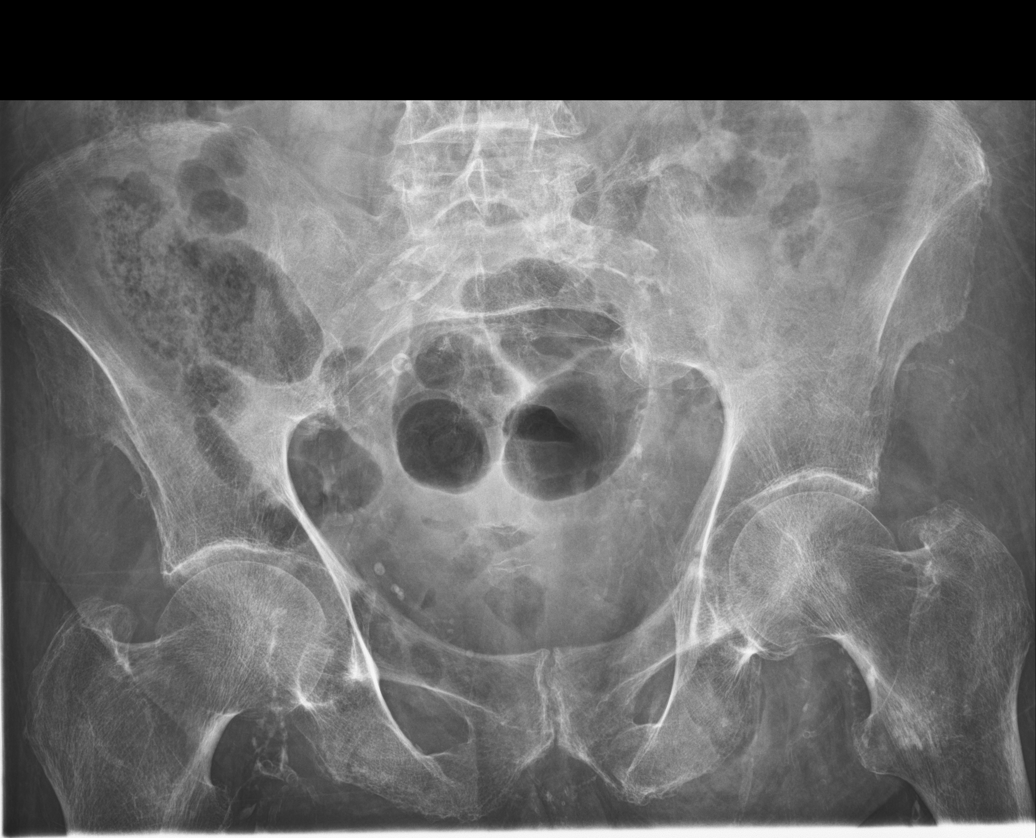

[hip ap (1 of 2)]
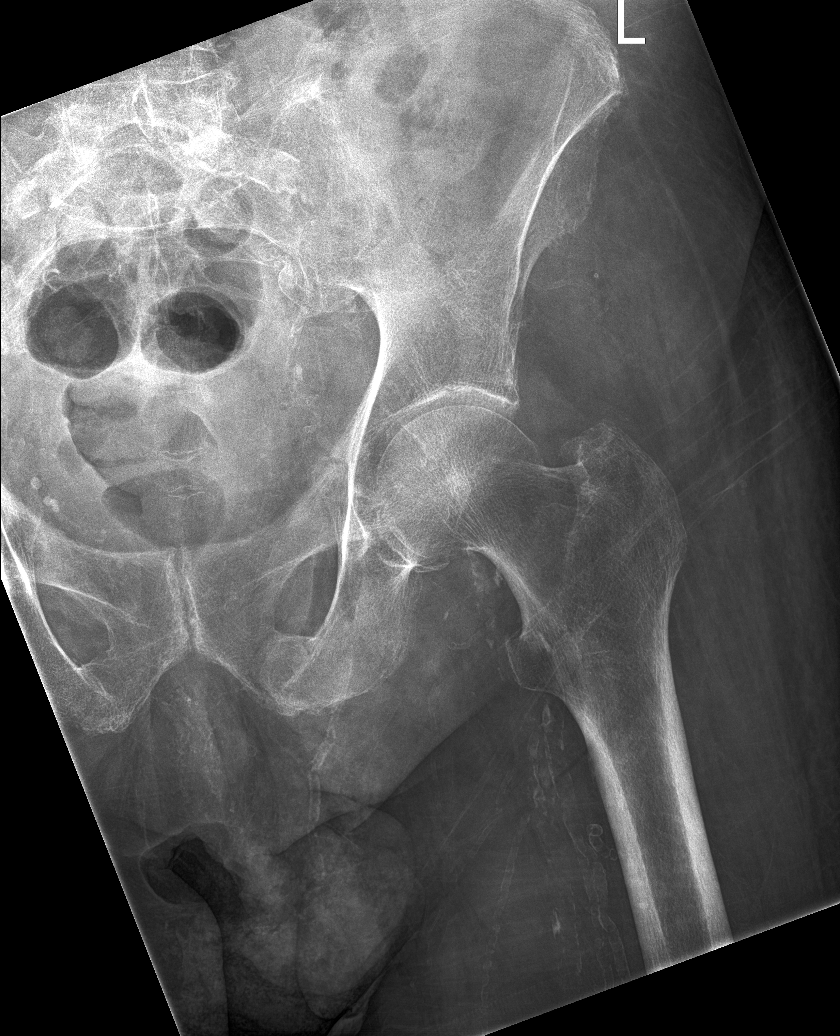

[hip ap (2 of 2)]
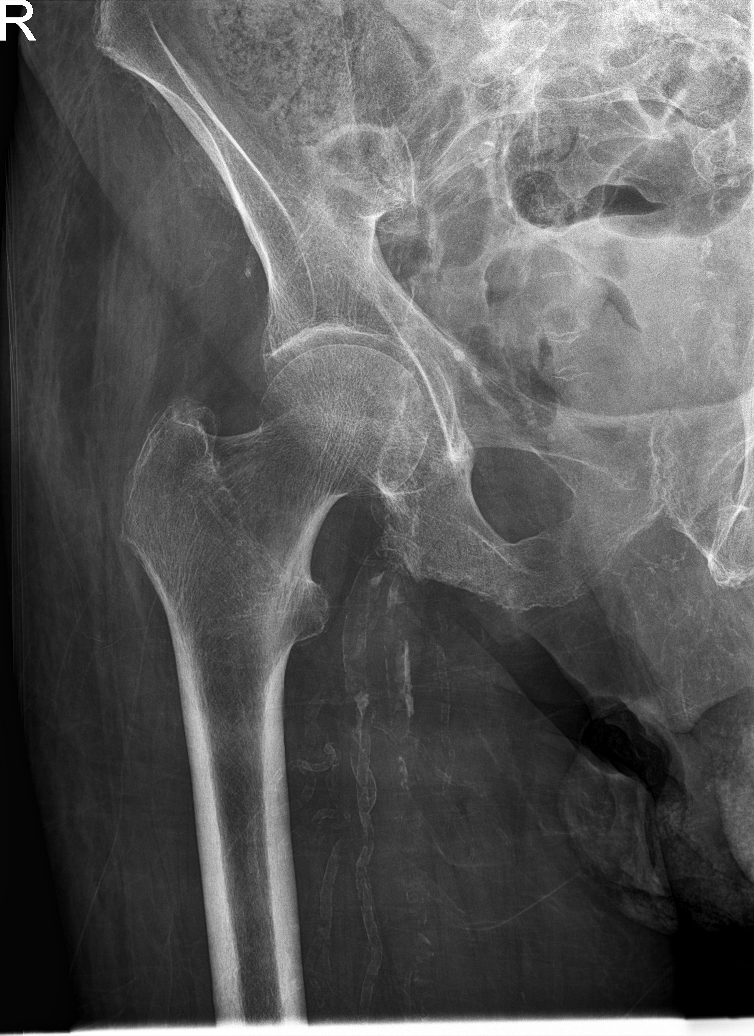

[hip frog leg (1 of 2)]
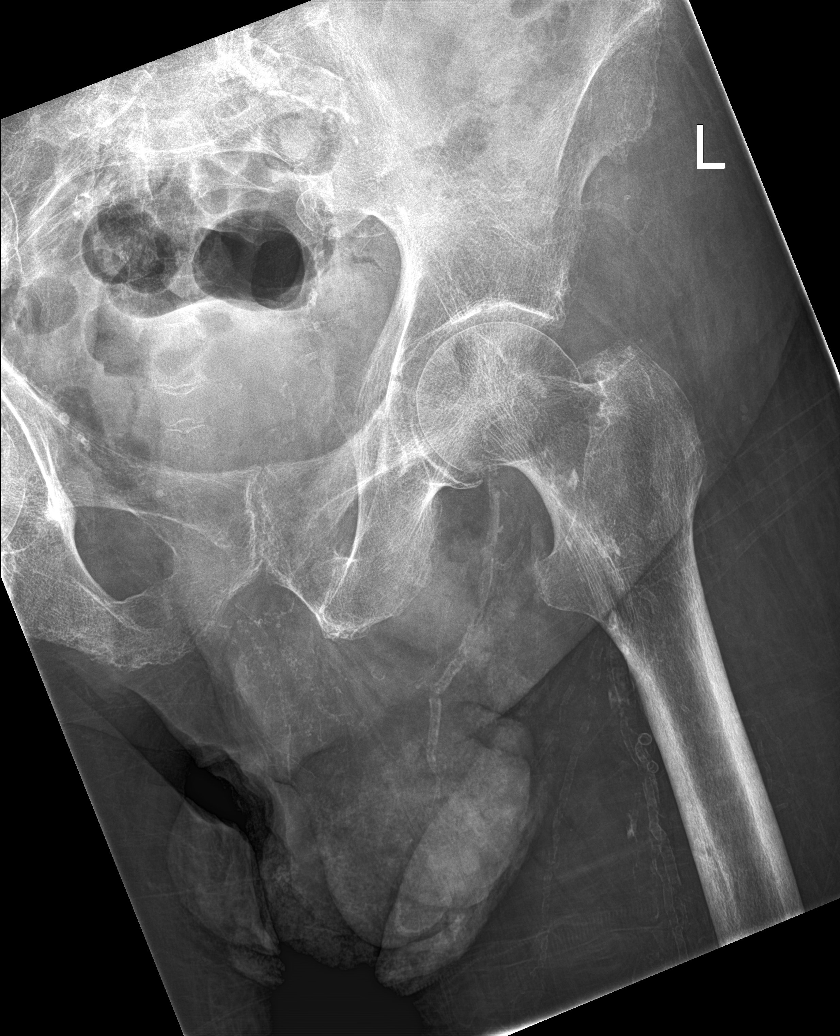

[hip frog leg (2 of 2)]
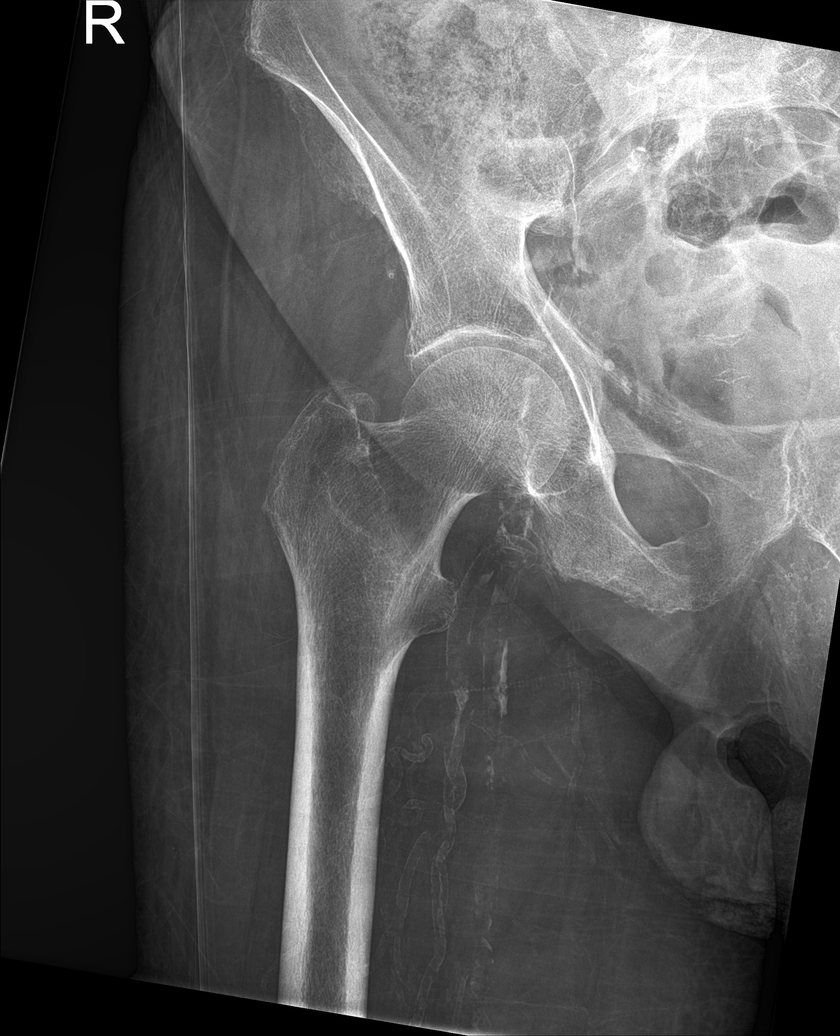

[pelvis ap (2 of 2)]
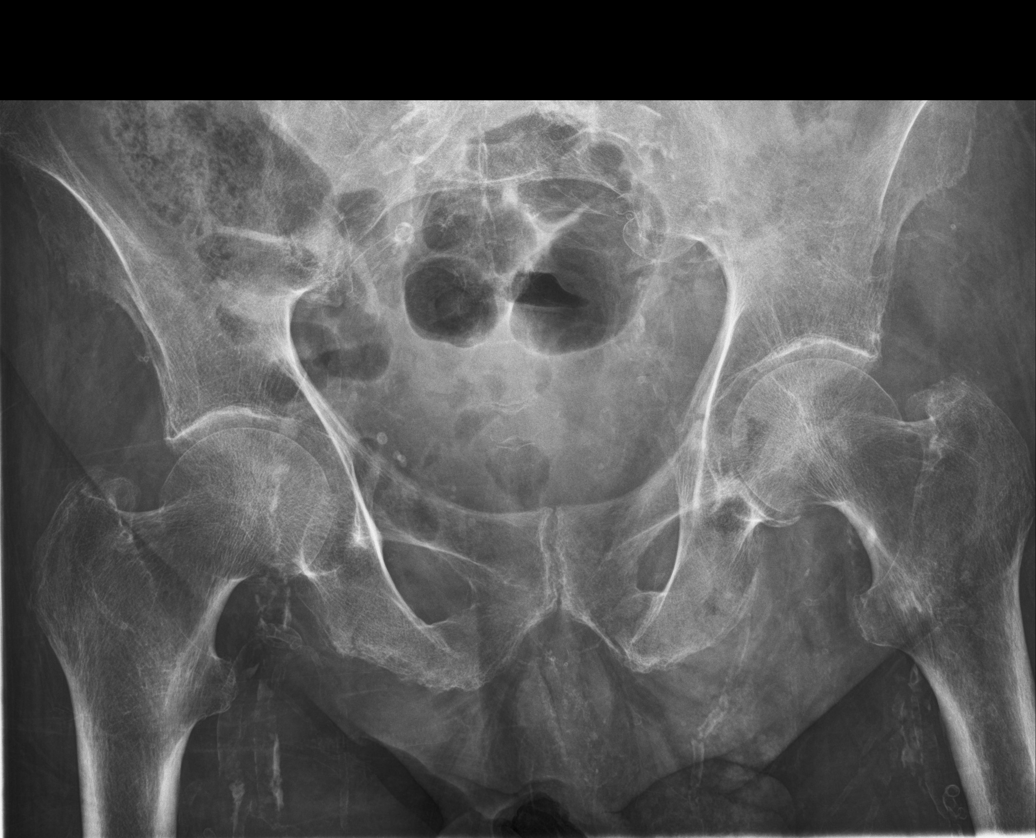

[6 of 6 positions shown; findings below may reference images not displayed]

FINDINGS: There is severe osteopenia. There is no hip fracture or dislocation.
There is a nondisplaced fracture of the left inferior pubic ramus.

There is peripheral vascular atherosclerotic disease.
IMPRESSION: 1. No hip fracture or dislocation.
2. Nondisplaced fracture of the left inferior pubic ramus.

## 2016-05-21 LAB — BLOOD GAS, ARTERIAL
ACID-BASE EXCESS: 3.3 mmol/L — AB (ref 0.0–2.0)
BICARBONATE: 28.2 meq/L — AB (ref 20.0–24.0)
DRAWN BY: 22223
O2 Content: 6 L/min
O2 Saturation: 98.5 %
PH ART: 7.371 (ref 7.350–7.450)
PO2 ART: 134 mmHg — AB (ref 80.0–100.0)
TCO2: 25.7 mmol/L (ref 0–100)
pCO2 arterial: 49.8 mmHg — ABNORMAL HIGH (ref 35.0–45.0)
